# Patient Record
Sex: Female | Born: 1990 | Race: Black or African American | Hispanic: Yes | Marital: Single | Smoking: Never smoker
Health system: Southern US, Community
[De-identification: ages and names within clinical notes are randomized; demographics above are authoritative.]

## PROBLEM LIST (undated history)

## (undated) DIAGNOSIS — J45909 Unspecified asthma, uncomplicated: Secondary | ICD-10-CM

## (undated) DIAGNOSIS — N946 Dysmenorrhea, unspecified: Secondary | ICD-10-CM

## (undated) HISTORY — PX: TONSILLECTOMY: SUR1361

## (undated) HISTORY — DX: Unspecified asthma, uncomplicated: J45.909

---

## 2013-08-04 ENCOUNTER — Ambulatory Visit: Payer: Self-pay

## 2013-08-04 DIAGNOSIS — Z3400 Encounter for supervision of normal first pregnancy, unspecified trimester: Secondary | ICD-10-CM

## 2013-08-04 DIAGNOSIS — O3680X0 Pregnancy with inconclusive fetal viability, not applicable or unspecified: Secondary | ICD-10-CM

## 2013-08-05 ENCOUNTER — Other Ambulatory Visit: Payer: Self-pay | Admitting: Obstetrics & Gynecology

## 2013-08-05 ENCOUNTER — Ambulatory Visit (HOSPITAL_COMMUNITY): Payer: Medicaid Other

## 2013-08-05 ENCOUNTER — Ambulatory Visit (HOSPITAL_COMMUNITY)
Admission: RE | Admit: 2013-08-05 | Discharge: 2013-08-05 | Disposition: A | Discharge status: Home / Self Care | Payer: Medicaid Other | Source: Ambulatory Visit | Attending: Obstetrics & Gynecology | Admitting: Obstetrics & Gynecology

## 2013-08-05 DIAGNOSIS — Z3689 Encounter for other specified antenatal screening: Secondary | ICD-10-CM | POA: Insufficient documentation

## 2013-08-05 DIAGNOSIS — O3680X0 Pregnancy with inconclusive fetal viability, not applicable or unspecified: Secondary | ICD-10-CM | POA: Insufficient documentation

## 2013-08-05 LAB — OBSTETRIC PANEL
Basophils Relative: 0 % (ref 0–1)
Eosinophils Absolute: 0.1 10*3/uL (ref 0.0–0.7)
Eosinophils Relative: 1 % (ref 0–5)
Lymphs Abs: 2.4 10*3/uL (ref 0.7–4.0)
MCH: 26.4 pg (ref 26.0–34.0)
MCHC: 33 g/dL (ref 30.0–36.0)
MCV: 80.1 fL (ref 78.0–100.0)
Platelets: 326 10*3/uL (ref 150–400)
RBC: 4.43 MIL/uL (ref 3.87–5.11)
RDW: 14.6 % (ref 11.5–15.5)
Rh Type: POSITIVE

## 2013-08-05 LAB — HIV ANTIBODY (ROUTINE TESTING W REFLEX): HIV: NONREACTIVE

## 2013-08-06 LAB — CULTURE, OB URINE: Colony Count: 50000

## 2013-08-06 LAB — HEMOGLOBINOPATHY EVALUATION
Hgb A2 Quant: 2.6 % (ref 2.2–3.2)
Hgb A: 97.4 % (ref 96.8–97.8)
Hgb S Quant: 0 %

## 2013-08-19 ENCOUNTER — Encounter: Payer: Self-pay | Admitting: Obstetrics & Gynecology

## 2013-09-02 ENCOUNTER — Encounter: Payer: Self-pay | Admitting: Obstetrics & Gynecology

## 2013-09-04 ENCOUNTER — Ambulatory Visit (INDEPENDENT_AMBULATORY_CARE_PROVIDER_SITE_OTHER): Payer: Medicaid Other | Admitting: Family Medicine

## 2013-09-04 ENCOUNTER — Encounter: Payer: Self-pay | Admitting: Family Medicine

## 2013-09-04 ENCOUNTER — Encounter: Payer: Self-pay | Admitting: *Deleted

## 2013-09-04 VITALS — BP 113/71 | Ht 63.0 in | Wt 226.6 lb

## 2013-09-04 DIAGNOSIS — Z3492 Encounter for supervision of normal pregnancy, unspecified, second trimester: Secondary | ICD-10-CM

## 2013-09-04 DIAGNOSIS — R7309 Other abnormal glucose: Secondary | ICD-10-CM | POA: Insufficient documentation

## 2013-09-04 DIAGNOSIS — O99810 Abnormal glucose complicating pregnancy: Secondary | ICD-10-CM

## 2013-09-04 DIAGNOSIS — Z3490 Encounter for supervision of normal pregnancy, unspecified, unspecified trimester: Secondary | ICD-10-CM | POA: Insufficient documentation

## 2013-09-04 MED ORDER — PRENATAL 27-0.8 MG PO TABS: 1.0000 | ORAL_TABLET | Freq: Every day | ORAL | Status: DC

## 2013-09-04 NOTE — Progress Notes (Signed)
Pulse: 97 Had pap in April at [facility], per patient was normal.

## 2013-09-04 NOTE — Patient Instructions (Addendum)
Pregnancy - Second Trimester The second trimester of pregnancy (3 to 6 months) is a period of rapid growth for you and your baby. At the end of the sixth month, your baby is about 9 inches long and weighs 1 1/2 pounds. You will begin to feel the baby move between 18 and 20 weeks of the pregnancy. This is called quickening. Weight gain is faster. A clear fluid (colostrum) may leak out of your breasts. You may feel small contractions of the womb (uterus). This is known as false labor or Braxton-Hicks contractions. This is like a practice for labor when the baby is ready to be born. Usually, the problems with morning sickness have usually passed by the end of your first trimester. Some women develop small dark blotches (called cholasma, mask of pregnancy) on their face that usually goes away after the baby is born. Exposure to the sun makes the blotches worse. Acne may also develop in some pregnant women and pregnant women who have acne, may find that it goes away. PRENATAL EXAMS  Blood work may continue to be done during prenatal exams. These tests are done to check on your health and the probable health of your baby. Blood work is used to follow your blood levels (hemoglobin). Anemia (low hemoglobin) is common during pregnancy. Iron and vitamins are given to help prevent this. You will also be checked for diabetes between 24 and 28 weeks of the pregnancy. Some of the previous blood tests may be repeated.  The size of the uterus is measured during each visit. This is to make sure that the baby is continuing to grow properly according to the dates of the pregnancy.  Your blood pressure is checked every prenatal visit. This is to make sure you are not getting toxemia.  Your urine is checked to make sure you do not have an infection, diabetes or protein in the urine.  Your weight is checked often to make sure gains are happening at the suggested rate. This is to ensure that both you and your baby are  growing normally.  Sometimes, an ultrasound is performed to confirm the proper growth and development of the baby. This is a test which bounces harmless sound waves off the baby so your caregiver can more accurately determine due dates. Sometimes, a test is done on the amniotic fluid surrounding the baby. This test is called an amniocentesis. The amniotic fluid is obtained by sticking a needle into the belly (abdomen). This is done to check the chromosomes in instances where there is a concern about possible genetic problems with the baby. It is also sometimes done near the end of pregnancy if an early delivery is required. In this case, it is done to help make sure the baby's lungs are mature enough for the baby to live outside of the womb. CHANGES OCCURING IN THE SECOND TRIMESTER OF PREGNANCY Your body goes through many changes during pregnancy. They vary from person to person. Talk to your caregiver about changes you notice that you are concerned about.  During the second trimester, you will likely have an increase in your appetite. It is normal to have cravings for certain foods. This varies from person to person and pregnancy to pregnancy.  Your lower abdomen will begin to bulge.  You may have to urinate more often because the uterus and baby are pressing on your bladder. It is also common to get more bladder infections during pregnancy. You can help this by drinking lots of fluids   and emptying your bladder before and after intercourse.  You may begin to get stretch marks on your hips, abdomen, and breasts. These are normal changes in the body during pregnancy. There are no exercises or medicines to take that prevent this change.  You may begin to develop swollen and bulging veins (varicose veins) in your legs. Wearing support hose, elevating your feet for 15 minutes, 3 to 4 times a day and limiting salt in your diet helps lessen the problem.  Heartburn may develop as the uterus grows and  pushes up against the stomach. Antacids recommended by your caregiver helps with this problem. Also, eating smaller meals 4 to 5 times a day helps.  Constipation can be treated with a stool softener or adding bulk to your diet. Drinking lots of fluids, and eating vegetables, fruits, and whole grains are helpful.  Exercising is also helpful. If you have been very active up until your pregnancy, most of these activities can be continued during your pregnancy. If you have been less active, it is helpful to start an exercise program such as walking.  Hemorrhoids may develop at the end of the second trimester. Warm sitz baths and hemorrhoid cream recommended by your caregiver helps hemorrhoid problems.  Backaches may develop during this time of your pregnancy. Avoid heavy lifting, wear low heal shoes, and practice good posture to help with backache problems.  Some pregnant women develop tingling and numbness of their hand and fingers because of swelling and tightening of ligaments in the wrist (carpel tunnel syndrome). This goes away after the baby is born.  As your breasts enlarge, you may have to get a bigger bra. Get a comfortable, cotton, support bra. Do not get a nursing bra until the last month of the pregnancy if you will be nursing the baby.  You may get a dark line from your belly button to the pubic area called the linea nigra.  You may develop rosy cheeks because of increase blood flow to the face.  You may develop spider looking lines of the face, neck, arms, and chest. These go away after the baby is born. HOME CARE INSTRUCTIONS   It is extremely important to avoid all smoking, herbs, alcohol, and unprescribed drugs during your pregnancy. These chemicals affect the formation and growth of the baby. Avoid these chemicals throughout the pregnancy to ensure the delivery of a healthy infant.  Most of your home care instructions are the same as suggested for the first trimester of your  pregnancy. Keep your caregiver's appointments. Follow your caregiver's instructions regarding medicine use, exercise, and diet.  During pregnancy, you are providing food for you and your baby. Continue to eat regular, well-balanced meals. Choose foods such as meat, fish, milk and other low fat dairy products, vegetables, fruits, and whole-grain breads and cereals. Your caregiver will tell you of the ideal weight gain.  A physical sexual relationship may be continued up until near the end of pregnancy if there are no other problems. Problems could include early (premature) leaking of amniotic fluid from the membranes, vaginal bleeding, abdominal pain, or other medical or pregnancy problems.  Exercise regularly if there are no restrictions. Check with your caregiver if you are unsure of the safety of some of your exercises. The greatest weight gain will occur in the last 2 trimesters of pregnancy. Exercise will help you:  Control your weight.  Get you in shape for labor and delivery.  Lose weight after you have the baby.  Wear   a good support or jogging bra for breast tenderness during pregnancy. This may help if worn during sleep. Pads or tissues may be used in the bra if you are leaking colostrum.  Do not use hot tubs, steam rooms or saunas throughout the pregnancy.  Wear your seat belt at all times when driving. This protects you and your baby if you are in an accident.  Avoid raw meat, uncooked cheese, cat litter boxes, and soil used by cats. These carry germs that can cause birth defects in the baby.  The second trimester is also a good time to visit your dentist for your dental health if this has not been done yet. Getting your teeth cleaned is okay. Use a soft toothbrush. Brush gently during pregnancy.  It is easier to leak urine during pregnancy. Tightening up and strengthening the pelvic muscles will help with this problem. Practice stopping your urination while you are going to the  bathroom. These are the same muscles you need to strengthen. It is also the muscles you would use as if you were trying to stop from passing gas. You can practice tightening these muscles up 10 times a set and repeating this about 3 times per day. Once you know what muscles to tighten up, do not perform these exercises during urination. It is more likely to contribute to an infection by backing up the urine.  Ask for help if you have financial, counseling, or nutritional needs during pregnancy. Your caregiver will be able to offer counseling for these needs as well as refer you for other special needs.  Your skin may become oily. If so, wash your face with mild soap, use non-greasy moisturizer and oil or cream based makeup. MEDICINES AND DRUG USE IN PREGNANCY  Take prenatal vitamins as directed. The vitamin should contain 1 milligram of folic acid. Keep all vitamins out of reach of children. Only a couple vitamins or tablets containing iron may be fatal to a baby or young child when ingested.  Avoid use of all medicines, including herbs, over-the-counter medicines, not prescribed or suggested by your caregiver. Only take over-the-counter or prescription medicines for pain, discomfort, or fever as directed by your caregiver. Do not use aspirin.  Let your caregiver also know about herbs you may be using.  Alcohol is related to a number of birth defects. This includes fetal alcohol syndrome. All alcohol, in any form, should be avoided completely. Smoking will cause low birth rate and premature babies.  Street or illegal drugs are very harmful to the baby. They are absolutely forbidden. A baby born to an addicted mother will be addicted at birth. The baby will go through the same withdrawal an adult does. SEEK MEDICAL CARE IF:  You have any concerns or worries during your pregnancy. It is better to call with your questions if you feel they cannot wait, rather than worry about them. SEEK IMMEDIATE  MEDICAL CARE IF:   An unexplained oral temperature above 102 F (38.9 C) develops, or as your caregiver suggests.  You have leaking of fluid from the vagina (birth canal). If leaking membranes are suspected, take your temperature and tell your caregiver of this when you call.  There is vaginal spotting, bleeding, or passing clots. Tell your caregiver of the amount and how many pads are used. Light spotting in pregnancy is common, especially following intercourse.  You develop a bad smelling vaginal discharge with a change in the color from clear to white.  You continue to feel   sick to your stomach (nauseated) and have no relief from remedies suggested. You vomit blood or coffee ground-like materials.  You lose more than 2 pounds of weight or gain more than 2 pounds of weight over 1 week, or as suggested by your caregiver.  You notice swelling of your face, hands, feet, or legs.  You get exposed to German measles and have never had them.  You are exposed to fifth disease or chickenpox.  You develop belly (abdominal) pain. Round ligament discomfort is a common non-cancerous (benign) cause of abdominal pain in pregnancy. Your caregiver still must evaluate you.  You develop a bad headache that does not go away.  You develop fever, diarrhea, pain with urination, or shortness of breath.  You develop visual problems, blurry, or double vision.  You fall or are in a car accident or any kind of trauma.  There is mental or physical violence at home. Document Released: 11/21/2001 Document Revised: 08/21/2012 Document Reviewed: 05/26/2009 [employer] Patient Information 2014 [employer].  

## 2013-09-04 NOTE — Progress Notes (Signed)
U/S scheduled 09/09/13 at 930 am.

## 2013-09-04 NOTE — Progress Notes (Signed)
Nutrition note: 1st visit consult Pt has h/o obesity Pt has gained 6.6# @ [redacted]w[redacted]d, which is wnl. Pt reports eating 3 meals & 0-1 snack/d. Pt is taking PNV. Pt reports occ N/V and heartburn. Pt received verbal & written education on general nutrition during pregnancy. Discussed tips to decrease N/V and heartburn. Discussed wt gain goals of 11-20# or 0.5#/wk. Pt agrees to continue taking PNV. Pt does not have WIC but plans to apply. Pt plans to BF. F/u if referred Blondell Reveal, MS, RD, LDN

## 2013-09-04 NOTE — Progress Notes (Signed)
  Subjective:    Jade Clark is being seen today for her first obstetrical visit.  This is a planned pregnancy. She is at [redacted]w[redacted]d gestation by LMP c/w [place]. Her obstetrical history is significant for obesity and family hx of DM. Relationship with FOB: significant other, living together. Patient does intend to breast feed. Pregnancy history fully reviewed.  Patient reports no complaints.  Review of Systems:   Review of Systems See above Objective:     BP 113/71  Ht 5\' 3"  (1.6 m)  Wt 226 lb 9.6 oz (102.785 kg)  BMI 40.15 kg/m2 Physical Exam  Exam BP 113/71  Ht 5\' 3"  (1.6 m)  Wt 226 lb 9.6 oz (102.785 kg)  BMI 40.15 kg/m2 GENERAL: Well-developed, well-nourished female in no acute distress.  HEENT: Normocephalic, atraumatic. Sclerae anicteric.  NECK: Supple. Normal thyroid.  LUNGS: Clear to auscultation bilaterally.  HEART: Regular rate and rhythm. BREASTS: deferred ABDOMEN: Soft, nontender, nondistended. No organomegaly. PELVIC: Normal external female genitalia. Vagina is pink and rugated.  Normal discharge. Normal cervix contour. Uterus is appropriate for dating. No adnexal mass or tenderness.  EXTREMITIES: No cyanosis, clubbing, or edema, 2+ distal pulses.    Assessment:    Pregnancy: G2P0010 Patient Active Problem List   Diagnosis Date Noted  . Supervision of normal pregnancy 09/04/2013  . Abnormal glucose tolerance test 09/04/2013       Plan:     Initial labs reviewed- done last week Abnormal 1 hour (150)- 3 hour done today  PAP not done as pt states she had one in April Prenatal vitamins. Problem list reviewed and updated. AFP3 discussed: ordered. Role of ultrasound in pregnancy discussed; fetal survey: requested. Hx of asthma- currently just using prn albuterol. Advised to let us know if this worsens Follow up in 4 weeks. 50% of 30 min visit spent on counseling and coordination of care.     [name] L 09/04/2013

## 2013-09-05 LAB — GLUCOSE TOLERANCE, 3 HOURS
Glucose Tolerance, 1 hour: 150 mg/dL (ref 70–189)
Glucose Tolerance, 2 hour: 106 mg/dL (ref 70–164)

## 2013-09-06 ENCOUNTER — Encounter: Payer: Self-pay | Admitting: *Deleted

## 2013-09-09 ENCOUNTER — Ambulatory Visit (HOSPITAL_COMMUNITY)
Admission: RE | Admit: 2013-09-09 | Discharge: 2013-09-09 | Disposition: A | Discharge status: Home / Self Care | Payer: Medicaid Other | Source: Ambulatory Visit | Attending: Family Medicine | Admitting: Family Medicine

## 2013-09-09 ENCOUNTER — Encounter: Payer: Self-pay | Admitting: Family Medicine

## 2013-09-09 ENCOUNTER — Other Ambulatory Visit: Payer: Self-pay | Admitting: Family Medicine

## 2013-09-09 DIAGNOSIS — Z3492 Encounter for supervision of normal pregnancy, unspecified, second trimester: Secondary | ICD-10-CM

## 2013-09-09 DIAGNOSIS — Z3689 Encounter for other specified antenatal screening: Secondary | ICD-10-CM | POA: Insufficient documentation

## 2013-09-09 LAB — AFP, QUAD SCREEN
Curr Gest Age: 19.1 wks.days
Down Syndrome Scr Risk Est: 1:8000 {titer}
INH: 197.7 pg/mL
Interpretation-AFP: NEGATIVE
MoM for INH: 1.37
MoM for hCG: 0.58
Osb Risk: 1:35800 {titer}
Tri 18 Scr Risk Est: NEGATIVE
Trisomy 18 (Edward) Syndrome Interp.: 1:8760 {titer}
uE3 Mom: 0.72

## 2013-10-02 ENCOUNTER — Ambulatory Visit (INDEPENDENT_AMBULATORY_CARE_PROVIDER_SITE_OTHER): Payer: Medicaid Other | Admitting: Family

## 2013-10-02 VITALS — BP 118/77 | Temp 98.0°F | Wt 225.0 lb

## 2013-10-02 DIAGNOSIS — Z3492 Encounter for supervision of normal pregnancy, unspecified, second trimester: Secondary | ICD-10-CM

## 2013-10-02 DIAGNOSIS — Z3480 Encounter for supervision of other normal pregnancy, unspecified trimester: Secondary | ICD-10-CM

## 2013-10-02 LAB — POCT URINALYSIS DIP (DEVICE)
Bilirubin Urine: NEGATIVE
Glucose, UA: NEGATIVE mg/dL
Ketones, ur: NEGATIVE mg/dL
Nitrite: NEGATIVE
Specific Gravity, Urine: 1.02 (ref 1.005–1.030)
pH: 7 (ref 5.0–8.0)

## 2013-10-02 NOTE — Progress Notes (Signed)
Reviewed [place] results and 3 hr results; schedule repeat in one week.  Right sided pain, intermittent in nature; no bleeding or leaking > provided reassurance.  Do 3 hr test at next visit.

## 2013-10-02 NOTE — Progress Notes (Signed)
Pulse- 90 Pain-sharp pain on right side

## 2013-10-09 ENCOUNTER — Ambulatory Visit (HOSPITAL_COMMUNITY)
Admission: RE | Admit: 2013-10-09 | Discharge: 2013-10-09 | Disposition: A | Discharge status: Home / Self Care | Payer: Medicaid Other | Source: Ambulatory Visit | Attending: Family | Admitting: Family

## 2013-10-09 ENCOUNTER — Other Ambulatory Visit: Payer: Self-pay | Admitting: Family

## 2013-10-09 DIAGNOSIS — Z3492 Encounter for supervision of normal pregnancy, unspecified, second trimester: Secondary | ICD-10-CM

## 2013-10-09 DIAGNOSIS — Z3689 Encounter for other specified antenatal screening: Secondary | ICD-10-CM | POA: Insufficient documentation

## 2013-10-12 ENCOUNTER — Encounter: Payer: Self-pay | Admitting: Family

## 2013-10-30 ENCOUNTER — Encounter: Payer: Self-pay | Admitting: Obstetrics and Gynecology

## 2013-10-30 ENCOUNTER — Ambulatory Visit (INDEPENDENT_AMBULATORY_CARE_PROVIDER_SITE_OTHER): Payer: Medicaid Other | Admitting: Obstetrics and Gynecology

## 2013-10-30 VITALS — BP 130/80 | Wt 228.3 lb

## 2013-10-30 DIAGNOSIS — Z3492 Encounter for supervision of normal pregnancy, unspecified, second trimester: Secondary | ICD-10-CM

## 2013-10-30 DIAGNOSIS — N898 Other specified noninflammatory disorders of vagina: Secondary | ICD-10-CM

## 2013-10-30 DIAGNOSIS — O99810 Abnormal glucose complicating pregnancy: Secondary | ICD-10-CM

## 2013-10-30 DIAGNOSIS — R7309 Other abnormal glucose: Secondary | ICD-10-CM

## 2013-10-30 LAB — POCT URINALYSIS DIP (DEVICE)
Bilirubin Urine: NEGATIVE
Hgb urine dipstick: NEGATIVE
Nitrite: NEGATIVE
Protein, ur: NEGATIVE mg/dL
Urobilinogen, UA: 0.2 mg/dL (ref 0.0–1.0)
pH: 7 (ref 5.0–8.0)

## 2013-10-30 NOTE — Progress Notes (Signed)
p=101 

## 2013-10-30 NOTE — Progress Notes (Signed)
Patient doing well without complaints. FM/PTL precautions reviewed. 3 hr GTT today. Patient complaining of white pruritic discharge. Wet prep collected. White thin discharge on exam

## 2013-10-30 NOTE — Addendum Note (Signed)
Addended by: Franchot Mimes on: 10/30/2013 09:47 AM   Modules accepted: Orders

## 2013-10-31 ENCOUNTER — Other Ambulatory Visit: Payer: Self-pay | Admitting: Obstetrics and Gynecology

## 2013-10-31 DIAGNOSIS — Z3492 Encounter for supervision of normal pregnancy, unspecified, second trimester: Secondary | ICD-10-CM

## 2013-10-31 LAB — GLUCOSE TOLERANCE, 3 HOURS
Glucose Tolerance, 2 hour: 114 mg/dL (ref 70–164)
Glucose Tolerance, Fasting: 80 mg/dL (ref 70–104)
Glucose, GTT - 3 Hour: 62 mg/dL — ABNORMAL LOW (ref 70–144)

## 2013-10-31 LAB — WET PREP, GENITAL: Trich, Wet Prep: NONE SEEN

## 2013-10-31 MED ORDER — METRONIDAZOLE 500 MG PO TABS: 500.0000 mg | ORAL_TABLET | Freq: Two times a day (BID) | ORAL | Status: DC

## 2013-10-31 MED ORDER — FLUCONAZOLE 150 MG PO TABS: 150.0000 mg | ORAL_TABLET | Freq: Once | ORAL | Status: DC

## 2013-11-03 ENCOUNTER — Telehealth: Payer: Self-pay

## 2013-11-03 NOTE — Telephone Encounter (Signed)
Message copied by Louanna Raw on Mon Nov 03, 2013 10:27 AM ------      Message from: Jade Clark      Created: Fri Oct 31, 2013 10:36 AM       Please inform patient of positive yeast and BV infection. Rx e-prescribed for both ------

## 2013-11-03 NOTE — Telephone Encounter (Signed)
Called pt. And informed her of her yeast infection and bacterial vaginosis infection. Informed her she can pick up the prescriptions at her [facility] pharmacy. Pt. Stated she is in [place] for the holidays; I explained that the [facility] pharmacy near her should have no problem transferring and filling the prescription so that she can get it today. Pt. Verbalized understanding and stated she had no other questions.

## 2013-11-14 ENCOUNTER — Telehealth: Payer: Self-pay | Admitting: *Deleted

## 2013-11-14 NOTE — Telephone Encounter (Signed)
Spoke with patient, patient states she has been sick for 1 day, has been taking antibiotic for 3 days.  She has taken diflucan in the past with no difficulty.  Educated patient to take with food. Pt verbalizes understanding.

## 2013-11-14 NOTE — Telephone Encounter (Signed)
Pt called nurse line requesting to speak with someone concerning sickness from antibiotic.

## 2013-11-26 ENCOUNTER — Ambulatory Visit (INDEPENDENT_AMBULATORY_CARE_PROVIDER_SITE_OTHER): Payer: Medicaid Other | Admitting: Obstetrics and Gynecology

## 2013-11-26 ENCOUNTER — Encounter: Payer: Self-pay | Admitting: Family Medicine

## 2013-11-26 VITALS — BP 124/83 | Wt 226.2 lb

## 2013-11-26 DIAGNOSIS — O9989 Other specified diseases and conditions complicating pregnancy, childbirth and the puerperium: Secondary | ICD-10-CM

## 2013-11-26 DIAGNOSIS — R7309 Other abnormal glucose: Secondary | ICD-10-CM

## 2013-11-26 DIAGNOSIS — O2610 Low weight gain in pregnancy, unspecified trimester: Secondary | ICD-10-CM | POA: Insufficient documentation

## 2013-11-26 DIAGNOSIS — Z23 Encounter for immunization: Secondary | ICD-10-CM

## 2013-11-26 DIAGNOSIS — O2613 Low weight gain in pregnancy, third trimester: Secondary | ICD-10-CM

## 2013-11-26 DIAGNOSIS — Z3493 Encounter for supervision of normal pregnancy, unspecified, third trimester: Secondary | ICD-10-CM

## 2013-11-26 LAB — POCT URINALYSIS DIP (DEVICE)
Bilirubin Urine: NEGATIVE
Leukocytes, UA: NEGATIVE
Nitrite: NEGATIVE
Protein, ur: NEGATIVE mg/dL
Specific Gravity, Urine: 1.02 (ref 1.005–1.030)
pH: 7.5 (ref 5.0–8.0)

## 2013-11-26 MED ORDER — TETANUS-DIPHTH-ACELL PERTUSSIS 5-2.5-18.5 LF-MCG/0.5 IM SUSP: 0.5000 mL | Freq: Once | INTRAMUSCULAR | Status: DC

## 2013-11-26 NOTE — Progress Notes (Signed)
+   FM; Denies LOF, VB, or contractions - Discusses Breast feeding and provided info - Wants to get Nexplanon for contraception - Schedule repeat US today' - Flu and Tdap today

## 2013-11-26 NOTE — Progress Notes (Signed)
Pulse: 99 

## 2013-11-26 NOTE — Patient Instructions (Signed)
Third Trimester of Pregnancy  The third trimester is from week 29 through week 42, months 7 through 9. The third trimester is a time when the fetus is growing rapidly. At the end of the ninth month, the fetus is about 20 inches in length and weighs 6 10 pounds.   BODY CHANGES  Your body goes through many changes during pregnancy. The changes vary from woman to woman.    Your weight will continue to increase. You can expect to gain 25 35 pounds (11 16 kg) by the end of the pregnancy.   You may begin to get stretch marks on your hips, abdomen, and breasts.   You may urinate more often because the fetus is moving lower into your pelvis and pressing on your bladder.   You may develop or continue to have heartburn as a result of your pregnancy.   You may develop constipation because certain hormones are causing the muscles that push waste through your intestines to slow down.   You may develop hemorrhoids or swollen, bulging veins (varicose veins).   You may have pelvic pain because of the weight gain and pregnancy hormones relaxing your joints between the bones in your pelvis. Back aches may result from over exertion of the muscles supporting your posture.   Your breasts will continue to grow and be tender. A yellow discharge may leak from your breasts called colostrum.   Your belly button may stick out.   You may feel short of breath because of your expanding uterus.   You may notice the fetus "dropping," or moving lower in your abdomen.   You may have a bloody mucus discharge. This usually occurs a few days to a week before labor begins.   Your cervix becomes thin and soft (effaced) near your due date.  WHAT TO EXPECT AT YOUR PRENATAL EXAMS   You will have prenatal exams every 2 weeks until week 36. Then, you will have weekly prenatal exams. During a routine prenatal visit:   You will be weighed to make sure you and the fetus are growing normally.   Your blood pressure is taken.   Your abdomen will be  measured to track your baby's growth.   The fetal heartbeat will be listened to.   Any test results from the previous visit will be discussed.   You may have a cervical check near your due date to see if you have effaced.  At around 36 weeks, your caregiver will check your cervix. At the same time, your caregiver will also perform a test on the secretions of the vaginal tissue. This test is to determine if a type of bacteria, Group B streptococcus, is present. Your caregiver will explain this further.  Your caregiver may ask you:   What your birth plan is.   How you are feeling.   If you are feeling the baby move.   If you have had any abnormal symptoms, such as leaking fluid, bleeding, severe headaches, or abdominal cramping.   If you have any questions.  Other tests or screenings that may be performed during your third trimester include:   Blood tests that check for low iron levels (anemia).   Fetal testing to check the health, activity level, and growth of the fetus. Testing is done if you have certain medical conditions or if there are problems during the pregnancy.  FALSE LABOR  You may feel small, irregular contractions that eventually go away. These are called Braxton Hicks contractions, or   false labor. Contractions may last for hours, days, or even weeks before true labor sets in. If contractions come at regular intervals, intensify, or become painful, it is best to be seen by your caregiver.   SIGNS OF LABOR    Menstrual-like cramps.   Contractions that are 5 minutes apart or less.   Contractions that start on the top of the uterus and spread down to the lower abdomen and back.   A sense of increased pelvic pressure or back pain.   A watery or bloody mucus discharge that comes from the vagina.  If you have any of these signs before the 37th week of pregnancy, call your caregiver right away. You need to go to the hospital to get checked immediately.  HOME CARE INSTRUCTIONS    Avoid all  smoking, herbs, alcohol, and unprescribed drugs. These chemicals affect the formation and growth of the baby.   Follow your caregiver's instructions regarding medicine use. There are medicines that are either safe or unsafe to take during pregnancy.   Exercise only as directed by your caregiver. Experiencing uterine cramps is a good sign to stop exercising.   Continue to eat regular, healthy meals.   Wear a good support bra for breast tenderness.   Do not use hot tubs, steam rooms, or saunas.   Wear your seat belt at all times when driving.   Avoid raw meat, uncooked cheese, cat litter boxes, and soil used by cats. These carry germs that can cause birth defects in the baby.   Take your prenatal vitamins.   Try taking a stool softener (if your caregiver approves) if you develop constipation. Eat more high-fiber foods, such as fresh vegetables or fruit and whole grains. Drink plenty of fluids to keep your urine clear or pale yellow.   Take warm sitz baths to soothe any pain or discomfort caused by hemorrhoids. Use hemorrhoid cream if your caregiver approves.   If you develop varicose veins, wear support hose. Elevate your feet for 15 minutes, 3 4 times a day. Limit salt in your diet.   Avoid heavy lifting, wear low heal shoes, and practice good posture.   Rest a lot with your legs elevated if you have leg cramps or low back pain.   Visit your dentist if you have not gone during your pregnancy. Use a soft toothbrush to brush your teeth and be gentle when you floss.   A sexual relationship may be continued unless your caregiver directs you otherwise.   Do not travel far distances unless it is absolutely necessary and only with the approval of your caregiver.   Take prenatal classes to understand, practice, and ask questions about the labor and delivery.   Make a trial run to the hospital.   Pack your hospital bag.   Prepare the baby's nursery.   Continue to go to all your prenatal visits as directed  by your caregiver.  SEEK MEDICAL CARE IF:   You are unsure if you are in labor or if your water has broken.   You have dizziness.   You have mild pelvic cramps, pelvic pressure, or nagging pain in your abdominal area.   You have persistent nausea, vomiting, or diarrhea.   You have a bad smelling vaginal discharge.   You have pain with urination.  SEEK IMMEDIATE MEDICAL CARE IF:    You have a fever.   You are leaking fluid from your vagina.   You have spotting or bleeding from your vagina.     You have severe abdominal cramping or pain.   You have rapid weight loss or gain.   You have shortness of breath with chest pain.   You notice sudden or extreme swelling of your face, hands, ankles, feet, or legs.   You have not felt your baby move in over an hour.   You have severe headaches that do not go away with medicine.   You have vision changes.  Document Released: 11/21/2001 Document Revised: 07/30/2013 Document Reviewed: 01/28/2013  [employer] Patient Information 2014 [employer].

## 2013-11-27 ENCOUNTER — Other Ambulatory Visit: Payer: Self-pay | Admitting: Family Medicine

## 2013-11-27 ENCOUNTER — Ambulatory Visit (HOSPITAL_COMMUNITY)
Admission: RE | Admit: 2013-11-27 | Discharge: 2013-11-27 | Disposition: A | Discharge status: Home / Self Care | Payer: Medicaid Other | Source: Ambulatory Visit | Attending: Obstetrics and Gynecology | Admitting: Obstetrics and Gynecology

## 2013-11-27 DIAGNOSIS — O2613 Low weight gain in pregnancy, third trimester: Secondary | ICD-10-CM

## 2013-11-27 DIAGNOSIS — E669 Obesity, unspecified: Secondary | ICD-10-CM | POA: Insufficient documentation

## 2013-11-27 DIAGNOSIS — O9989 Other specified diseases and conditions complicating pregnancy, childbirth and the puerperium: Secondary | ICD-10-CM | POA: Insufficient documentation

## 2013-12-02 ENCOUNTER — Other Ambulatory Visit: Payer: Self-pay | Admitting: Family Medicine

## 2013-12-02 DIAGNOSIS — R7309 Other abnormal glucose: Secondary | ICD-10-CM

## 2013-12-03 ENCOUNTER — Ambulatory Visit (HOSPITAL_COMMUNITY)
Admission: RE | Admit: 2013-12-03 | Discharge: 2013-12-03 | Disposition: A | Discharge status: Home / Self Care | Payer: Medicaid Other | Source: Ambulatory Visit | Attending: Obstetrics and Gynecology | Admitting: Obstetrics and Gynecology

## 2013-12-03 DIAGNOSIS — E669 Obesity, unspecified: Secondary | ICD-10-CM | POA: Insufficient documentation

## 2013-12-03 DIAGNOSIS — R7309 Other abnormal glucose: Secondary | ICD-10-CM

## 2013-12-03 DIAGNOSIS — O9989 Other specified diseases and conditions complicating pregnancy, childbirth and the puerperium: Secondary | ICD-10-CM | POA: Insufficient documentation

## 2013-12-05 ENCOUNTER — Encounter: Payer: Self-pay | Admitting: Family Medicine

## 2013-12-05 DIAGNOSIS — O365990 Maternal care for other known or suspected poor fetal growth, unspecified trimester, not applicable or unspecified: Secondary | ICD-10-CM | POA: Insufficient documentation

## 2013-12-11 NOTE — L&D Delivery Note (Signed)
I have seen and examined this patient and I agree with the above. Cord blood collected both for hospital sample and for [facility]. Cam HaiSHAW, Lisett Dirusso 11:56 PM 01/21/2014

## 2013-12-11 NOTE — L&D Delivery Note (Signed)
Delivery Note At 8:50 PM a viable and healthy female was delivered via Vaginal, Spontaneous Delivery (Presentation: ; Occiput Anterior).  APGAR: 8, 9.  Placenta status: Intact, Spontaneous.  Cord: 3 vessels with the following complications: None.    Baby delivered with good maternal effort over intact perineum.  Immediately placed on maternal abdomen.  Good tone, spontaneous cry. Cord Blood drawn for blood bank.  Active management of third stage with cord traction. Placenta intact. 3V cord.  Uterine massage and pitocin. Minimal blood loss.  All counts correct.  Mom and Baby are doing well  Anesthesia: Epidural  Episiotomy: None Lacerations: None Suture Repair: none Est. Blood Loss (mL):   Mom to postpartum.  Baby to Couplet care / Skin to Skin.  Quincy SimmondsFeeney, Diani Jillson L 01/21/2014, 9:20 PM

## 2013-12-17 ENCOUNTER — Ambulatory Visit (INDEPENDENT_AMBULATORY_CARE_PROVIDER_SITE_OTHER): Payer: Medicaid Other | Admitting: Advanced Practice Midwife

## 2013-12-17 VITALS — BP 115/81 | Temp 98.6°F | Wt 221.4 lb

## 2013-12-17 DIAGNOSIS — O365990 Maternal care for other known or suspected poor fetal growth, unspecified trimester, not applicable or unspecified: Secondary | ICD-10-CM

## 2013-12-17 DIAGNOSIS — R7309 Other abnormal glucose: Secondary | ICD-10-CM

## 2013-12-17 LAB — POCT URINALYSIS DIP (DEVICE)
Bilirubin Urine: NEGATIVE
GLUCOSE, UA: NEGATIVE mg/dL
HGB URINE DIPSTICK: NEGATIVE
KETONES UR: NEGATIVE mg/dL
NITRITE: NEGATIVE
Protein, ur: NEGATIVE mg/dL
Specific Gravity, Urine: 1.015 (ref 1.005–1.030)
Urobilinogen, UA: 0.2 mg/dL (ref 0.0–1.0)
pH: 7 (ref 5.0–8.0)

## 2013-12-17 MED ORDER — ENSURE PO LIQD: 237.0000 mL | Freq: Two times a day (BID) | ORAL | Status: DC

## 2013-12-17 MED ORDER — PROMETHAZINE HCL 25 MG PO TABS: 12.5000 mg | ORAL_TABLET | Freq: Four times a day (QID) | ORAL | Status: DC | PRN

## 2013-12-17 MED ORDER — ALBUTEROL SULFATE HFA 108 (90 BASE) MCG/ACT IN AERS: 2.0000 | INHALATION_SPRAY | Freq: Four times a day (QID) | RESPIRATORY_TRACT | Status: AC | PRN

## 2013-12-17 NOTE — Progress Notes (Signed)
p-[id number] 

## 2013-12-17 NOTE — Progress Notes (Signed)
Doing well.  Good fetal movement, denies vaginal bleeding, LOF, regular contractions.  Unable to eat much during the day due to nausea.  Vomiting 2-3x/week only.  [facility]  Pt also reports difficulty sleeping.  Phenergan 12.5-25 mg Q6 hours for nausea and to aid sleep. Start twice weekly testing this week.

## 2013-12-19 ENCOUNTER — Ambulatory Visit (INDEPENDENT_AMBULATORY_CARE_PROVIDER_SITE_OTHER): Payer: Medicaid Other | Admitting: *Deleted

## 2013-12-19 VITALS — BP 112/58

## 2013-12-19 DIAGNOSIS — O365990 Maternal care for other known or suspected poor fetal growth, unspecified trimester, not applicable or unspecified: Secondary | ICD-10-CM

## 2013-12-19 NOTE — Progress Notes (Signed)
P = 100   US @ [facility] on 1/13 for growth, BPP, UA doppler

## 2013-12-19 NOTE — Progress Notes (Signed)
NST 12/19/13 reactive 

## 2013-12-23 ENCOUNTER — Ambulatory Visit (HOSPITAL_COMMUNITY)
Admission: RE | Admit: 2013-12-23 | Discharge: 2013-12-23 | Disposition: A | Discharge status: Home / Self Care | Payer: Medicaid Other | Source: Ambulatory Visit | Attending: Advanced Practice Midwife | Admitting: Advanced Practice Midwife

## 2013-12-23 VITALS — BP 125/77 | HR 98 | Wt 225.0 lb

## 2013-12-23 DIAGNOSIS — O365990 Maternal care for other known or suspected poor fetal growth, unspecified trimester, not applicable or unspecified: Secondary | ICD-10-CM | POA: Insufficient documentation

## 2013-12-23 DIAGNOSIS — O99210 Obesity complicating pregnancy, unspecified trimester: Secondary | ICD-10-CM

## 2013-12-23 DIAGNOSIS — O9989 Other specified diseases and conditions complicating pregnancy, childbirth and the puerperium: Secondary | ICD-10-CM | POA: Insufficient documentation

## 2013-12-23 DIAGNOSIS — O99891 Other specified diseases and conditions complicating pregnancy: Secondary | ICD-10-CM | POA: Insufficient documentation

## 2013-12-23 DIAGNOSIS — J45909 Unspecified asthma, uncomplicated: Secondary | ICD-10-CM | POA: Insufficient documentation

## 2013-12-23 DIAGNOSIS — E669 Obesity, unspecified: Secondary | ICD-10-CM | POA: Insufficient documentation

## 2013-12-25 ENCOUNTER — Other Ambulatory Visit: Payer: Self-pay | Admitting: Family Medicine

## 2013-12-25 ENCOUNTER — Ambulatory Visit (INDEPENDENT_AMBULATORY_CARE_PROVIDER_SITE_OTHER): Payer: Medicaid Other | Admitting: Family Medicine

## 2013-12-25 VITALS — BP 116/59 | Temp 98.4°F | Wt 224.0 lb

## 2013-12-25 DIAGNOSIS — Z3480 Encounter for supervision of other normal pregnancy, unspecified trimester: Secondary | ICD-10-CM

## 2013-12-25 DIAGNOSIS — O365990 Maternal care for other known or suspected poor fetal growth, unspecified trimester, not applicable or unspecified: Secondary | ICD-10-CM

## 2013-12-25 DIAGNOSIS — Z3490 Encounter for supervision of normal pregnancy, unspecified, unspecified trimester: Secondary | ICD-10-CM

## 2013-12-25 LAB — POCT URINALYSIS DIP (DEVICE)
Bilirubin Urine: NEGATIVE
Glucose, UA: NEGATIVE mg/dL
Hgb urine dipstick: NEGATIVE
Ketones, ur: NEGATIVE mg/dL
Leukocytes, UA: NEGATIVE
Nitrite: NEGATIVE
Protein, ur: NEGATIVE mg/dL
Specific Gravity, Urine: 1.02 (ref 1.005–1.030)
Urobilinogen, UA: 0.2 mg/dL (ref 0.0–1.0)
pH: 7 (ref 5.0–8.0)

## 2013-12-25 NOTE — Progress Notes (Signed)
P=73,

## 2013-12-25 NOTE — Patient Instructions (Signed)
Depresin postparto y depresin puerperal  (Postpartum Depression and Baby Blues) El perodo del postparto comienza inmediatamente despus del nacimiento del beb. Durante este tiempo, a menudo hay mucha alegra y emocin. Tambin es un tiempo de cambios considerables en la vida de [name]. Independientemente de las veces que [employer] da a luz, cada nio trae nuevos desafos y se modifica la dinmica de la familia. No es inusual tener sentimientos de emocin acompaados de cambios confusos en los estados de nimo, las emociones y los pensamientos. Todas las [employer] estn en riesgo de desarrollar depresin posparto o depresin puerperal. Estos cambios en el estado de nimo pueden ocurrir inmediatamente despus de dar a luz, o muchos meses despus. La depresin puerperal o la depresin posparto puede ser leve o grave. Adems, se puede resolver rpidamente, o puede ser un trastorno que dure [name].  CAUSAS  Los niveles elevados de hormonas y su rpido descenso son la causa principal de la depresin posparto y la depresin puerperal. Hay un conjunto de hormonas que se modifican radicalmente durante y despus del Psychiatrist. Los estrgenos y la progesterona generalmente disminuyen inmediatamente luego del [place]. Los niveles de hormona tiroidea y el cortisol y esteroides tambin disminuyen rpidamente. Entre otros factores que juegan un papel importante en estos cambios se incluyen los eventos importantes de la vida y los factores genticos.  FACTORES DE RIESGO  Si tiene cualquiera de los siguientes riesgos para la depresin posparto o depresin puerperal, sepa cules son los sntomas a tener en cuenta durante el perodo del posparto. Los factores de riesgo que pueden aumentar la probabilidad de sufrir este trastorno son:   Antecedentes familiares o personales de depresin.  Haber sufrido depresin mientras estuvo embarazada.  Haber sufrido trastornos del estado de nimo en perodos premenstruales o  con el uso de anticonceptivos orales.  Haber sufrido ests excepcional durante la vida.  Tener conflictos maritales.  No tener una red de apoyo social.  Jade Clark un beb con necesidades especiales.  Tener problemas de salud tales como diabetes. SNTOMAS  Los sntomas de la depresin puerperal son:   Electronics engineer en el estado de nimo, como ir desde la extrema felicidad a la extrema tristeza.  Disminucin de Cabin crew.  Dificultad para dormir.  Episodios de llanto, ganas de llorar.  Irritabilidad.  Ansiedad. Los sntomas de la depresion postparto generalmente comienzan dentro del primer mes despus de haber dado a luz. Estos sntomas son:   Dificultad para dormirse o somnolencia excesiva.  Marcada prdida de peso.  Agitacin.  Sentimientos de Haematologist.  Falta de inters en la actividad o la comida. La psicosis puerperal es una enfermedad muy preocupante y puede ser peligrosa. Afortunadamente, es un trastorno raro. Sufrir alguno de los siguientes sntomas es motivo de atencin mdica inmediata. Los sntomas de psicosis puerperal son:   Alucinaciones e ilusiones.  Conducta bizarra o desorganizada.  Confusin o desorientacin. DIAGNSTICO  El diagnstico se realiza por medio de la evaluacin de los sntomas. No hay pruebas mdicas o de laboratorio, que llevan a un diagnstico, pero existen varios cuestionarios que un mdico puede Chemical engineer para identificar la depresin posparto, la depresin o la psicosis puerperal. En algunos casos se utiliza una herramienta de evaluacin denominada [place] de Depresin Postnatal de [name] para diagnosticar este trastorno.  TRATAMIENTO  La depresin postparto por lo general desaparece por s sola en 1 a 2 semanas. El apoyo social es a menudo todo lo que se necesita. Debe ser alentada a dormir y Lawyer o  suficiente. Ocasionalmente, es posible que se le administren medicamentos para ayudarla a dormir.  La depresin  posparto requiere tratamiento, ya que puede durar varios meses o ms tiempo si no se trata. El [address]tratamiento puede incluir terapia individual o de grupo, medicamentos o ambos para Radio producerhacer frente a los Marine scientistfactores sociales, fisiolgicos y psicolgicos que pueden desempear un papel en la depresin. El ejercicio regular, una dieta saludable, el descanso y el apoyo social tambin puede ser muy recomendables.  La psicosis puerperal es ms grave y necesita tratamiento inmediato. A menudo es necesaria la hospitalizacin.  INSTRUCCIONES PARA EL CUIDADO EN EL HOGAR   Descanse todo lo que pueda. Tome una siesta mientras el beb duerme.  Haga ejercicios regularmente. Para algunas mujeres es beneficioso el yoga y las caminatas.  Consuma una dieta balanceada y nutritiva.  Haga pequeas cosas que disfrute hacer. Tome una taza de t, dese un bao de espuma, lea su revista favorita o escuche la msica que ms [place] gusta.  Evite el alcohol.  Pida ayuda con las tareas [address] Streetdomsticas, la cocina, las compras o Rockledgemandados. No trate de hacer todo.  Hable con la gente ms cercana a usted acerca de cmo se siente. Guineaonsiga ayuda de [address] [place] [address] pareja, los miembros de su familia, amigos u otras madres recientes.  Trate de tener pensamientos positivos. Piense en las cosas que le resultan gratificantes.  No pase mucho tiempo sola.  Tome slo la medicacin que le indic el profesional.  Cumpla con todos los controles del postparto.  Hable con su mdico si tiene preocupaciones. SOLICITE ATENCIN MDICA SI:  Shelle Ironiene una reaccin o problemas con su medicamento.  SOLICITE ATENCIN MDICA DE INMEDIATO SI:   Tiene ideas suicidas.  Siente que puede daar al beb o a Engineer, maintenance (IT)otra persona. Document Released: 05/15/2008 Document Revised: 02/19/2012 [facility] Patient Information 2014 [place], [place].

## 2013-12-25 NOTE — Progress Notes (Signed)
NST reviewed and reactive. Discussed case with Dr. Collier BullockWhitecar--once weekly BPP is sufficient antenatal testing--no longer needs NST's.

## 2013-12-30 ENCOUNTER — Encounter (HOSPITAL_COMMUNITY): Payer: Self-pay

## 2013-12-30 ENCOUNTER — Other Ambulatory Visit: Payer: Self-pay | Admitting: Family Medicine

## 2013-12-30 ENCOUNTER — Ambulatory Visit (HOSPITAL_COMMUNITY)
Admission: RE | Admit: 2013-12-30 | Discharge: 2013-12-30 | Disposition: A | Discharge status: Home / Self Care | Payer: Medicaid Other | Source: Ambulatory Visit | Attending: Family Medicine | Admitting: Family Medicine

## 2013-12-30 DIAGNOSIS — O99891 Other specified diseases and conditions complicating pregnancy: Secondary | ICD-10-CM | POA: Insufficient documentation

## 2013-12-30 DIAGNOSIS — E669 Obesity, unspecified: Secondary | ICD-10-CM | POA: Insufficient documentation

## 2013-12-30 DIAGNOSIS — J45909 Unspecified asthma, uncomplicated: Secondary | ICD-10-CM | POA: Insufficient documentation

## 2013-12-30 DIAGNOSIS — O99210 Obesity complicating pregnancy, unspecified trimester: Secondary | ICD-10-CM

## 2013-12-30 DIAGNOSIS — O9989 Other specified diseases and conditions complicating pregnancy, childbirth and the puerperium: Secondary | ICD-10-CM

## 2013-12-30 DIAGNOSIS — O365990 Maternal care for other known or suspected poor fetal growth, unspecified trimester, not applicable or unspecified: Secondary | ICD-10-CM

## 2013-12-31 ENCOUNTER — Other Ambulatory Visit: Payer: Self-pay | Admitting: Family Medicine

## 2013-12-31 DIAGNOSIS — O358XX0 Maternal care for other (suspected) fetal abnormality and damage, not applicable or unspecified: Secondary | ICD-10-CM

## 2014-01-01 ENCOUNTER — Ambulatory Visit (INDEPENDENT_AMBULATORY_CARE_PROVIDER_SITE_OTHER): Payer: Medicaid Other | Admitting: Family Medicine

## 2014-01-01 VITALS — BP 115/76 | Temp 98.6°F | Wt 222.3 lb

## 2014-01-01 DIAGNOSIS — O2610 Low weight gain in pregnancy, unspecified trimester: Secondary | ICD-10-CM

## 2014-01-01 DIAGNOSIS — O9989 Other specified diseases and conditions complicating pregnancy, childbirth and the puerperium: Secondary | ICD-10-CM

## 2014-01-01 DIAGNOSIS — O365990 Maternal care for other known or suspected poor fetal growth, unspecified trimester, not applicable or unspecified: Secondary | ICD-10-CM

## 2014-01-01 LAB — POCT URINALYSIS DIP (DEVICE)
Bilirubin Urine: NEGATIVE
Glucose, UA: NEGATIVE mg/dL
Hgb urine dipstick: NEGATIVE
Ketones, ur: NEGATIVE mg/dL
LEUKOCYTES UA: NEGATIVE
Nitrite: NEGATIVE
Protein, ur: 30 mg/dL — AB
Specific Gravity, Urine: 1.025 (ref 1.005–1.030)
UROBILINOGEN UA: 0.2 mg/dL (ref 0.0–1.0)
pH: 7 (ref 5.0–8.0)

## 2014-01-01 LAB — OB RESULTS CONSOLE GC/CHLAMYDIA
CHLAMYDIA, DNA PROBE: NEGATIVE
Gonorrhea: NEGATIVE

## 2014-01-01 LAB — OB RESULTS CONSOLE GBS: GBS: POSITIVE

## 2014-01-01 NOTE — Progress Notes (Signed)
Cultures today BPP 8/8 with nml dopplers on 1/21.

## 2014-01-01 NOTE — Patient Instructions (Signed)
Breastfeeding Deciding to breastfeed is one of the best choices you can make for you and your baby. A change in hormones during pregnancy causes your breast tissue to grow and increases the number and size of your milk ducts. These hormones also allow proteins, sugars, and fats from your blood supply to make breast milk in your milk-producing glands. Hormones prevent breast milk from being released before your baby is born as well as prompt milk flow after birth. Once breastfeeding has begun, thoughts of your baby, as well as his or her sucking or crying, can stimulate the release of milk from your milk-producing glands.  BENEFITS OF BREASTFEEDING For Your Baby  Your first milk (colostrum) helps your baby's digestive system function better.   There are antibodies in your milk that help your baby fight off infections.   Your baby has a lower incidence of asthma, allergies, and sudden infant death syndrome.   The nutrients in breast milk are better for your baby than infant formulas and are designed uniquely for your baby's needs.   Breast milk improves your baby's brain development.   Your baby is less likely to develop other conditions, such as childhood obesity, asthma, or type 2 diabetes mellitus.  For You   Breastfeeding helps to create a very special bond between you and your baby.   Breastfeeding is convenient. Breast milk is always available at the correct temperature and costs nothing.   Breastfeeding helps to burn calories and helps you lose the weight gained during pregnancy.   Breastfeeding makes your uterus contract to its prepregnancy size faster and slows bleeding (lochia) after you give birth.   Breastfeeding helps to lower your risk of developing type 2 diabetes mellitus, osteoporosis, and breast or ovarian cancer later in life. SIGNS THAT YOUR BABY IS HUNGRY Early Signs of Hunger  Increased alertness or activity.  Stretching.  Movement of the head from  side to side.  Movement of the head and opening of the mouth when the corner of the mouth or cheek is stroked (rooting).  Increased sucking sounds, smacking lips, cooing, sighing, or squeaking.  Hand-to-mouth movements.  Increased sucking of fingers or hands. Late Signs of Hunger  Fussing.  Intermittent crying. Extreme Signs of Hunger Signs of extreme hunger will require calming and consoling before your baby will be able to breastfeed successfully. Do not wait for the following signs of extreme hunger to occur before you initiate breastfeeding:   Restlessness.  A loud, strong cry.   Screaming. BREASTFEEDING BASICS Breastfeeding Initiation  Find a comfortable place to sit or lie down, with your neck and back well supported.  Place a pillow or rolled up blanket under your baby to bring him or her to the level of your breast (if you are seated). Nursing pillows are specially designed to help support your arms and your baby while you breastfeed.  Make sure that your baby's abdomen is facing your abdomen.   Gently massage your breast. With your fingertips, massage from your chest wall toward your nipple in a circular motion. This encourages milk flow. You may need to continue this action during the feeding if your milk flows slowly.  Support your breast with 4 fingers underneath and your thumb above your nipple. Make sure your fingers are well away from your nipple and your baby's mouth.   Stroke your baby's lips gently with your finger or nipple.   When your baby's mouth is open wide enough, quickly bring your baby to your   breast, placing your entire nipple and as much of the colored area around your nipple (areola) as possible into your baby's mouth.   More areola should be visible above your baby's upper lip than below the lower lip.   Your baby's tongue should be between his or her lower gum and your breast.   Ensure that your baby's mouth is correctly positioned  around your nipple (latched). Your baby's lips should create a seal on your breast and be turned out (everted).  It is common for your baby to suck about 2 3 minutes in order to start the flow of breast milk. Latching Teaching your baby how to latch on to your breast properly is very important. An improper latch can cause nipple pain and decreased milk supply for you and poor weight gain in your baby. Also, if your baby is not latched onto your nipple properly, he or she may swallow some air during feeding. This can make your baby fussy. Burping your baby when you switch breasts during the feeding can help to get rid of the air. However, teaching your baby to latch on properly is still the best way to prevent fussiness from swallowing air while breastfeeding. Signs that your baby has successfully latched on to your nipple:    Silent tugging or silent sucking, without causing you pain.   Swallowing heard between every 3 4 sucks.    Muscle movement above and in front of his or her ears while sucking.  Signs that your baby has not successfully latched on to nipple:   Sucking sounds or smacking sounds from your baby while breastfeeding.  Nipple pain. If you think your baby has not latched on correctly, slip your finger into the corner of your baby's mouth to break the suction and place it between your baby's gums. Attempt breastfeeding initiation again. Signs of Successful Breastfeeding Signs from your baby:   A gradual decrease in the number of sucks or complete cessation of sucking.   Falling asleep.   Relaxation of his or her body.   Retention of a small amount of milk in his or her mouth.   Letting go of your breast by himself or herself. Signs from you:  Breasts that have increased in firmness, weight, and size 1 3 hours after feeding.   Breasts that are softer immediately after breastfeeding.  Increased milk volume, as well as a change in milk consistency and color by  the 5th day of breastfeeding.   Nipples that are not sore, cracked, or bleeding. Signs That Your Baby is Getting Enough Milk  Wetting at least 3 diapers in a 24-hour period. The urine should be clear and pale yellow by age 5 days.  At least 3 stools in a 24-hour period by age 5 days. The stool should be soft and yellow.  At least 3 stools in a 24-hour period by age 7 days. The stool should be seedy and yellow.  No loss of weight greater than 10% of birth weight during the first 3 days of age.  Average weight gain of 4 7 ounces (120 210 mL) per week after age 4 days.  Consistent daily weight gain by age 5 days, without weight loss after the age of 2 weeks. After a feeding, your baby may spit up a small amount. This is common. BREASTFEEDING FREQUENCY AND DURATION Frequent feeding will help you make more milk and can prevent sore nipples and breast engorgement. Breastfeed when you feel the need to reduce   the fullness of your breasts or when your baby shows signs of hunger. This is called "breastfeeding on demand." Avoid introducing a pacifier to your baby while you are working to establish breastfeeding (the first 4 6 weeks after your baby is born). After this time you may choose to use a pacifier. Research has shown that pacifier use during the first year of a baby's life decreases the risk of sudden infant death syndrome (SIDS). Allow your baby to feed on each breast as long as he or she wants. Breastfeed until your baby is finished feeding. When your baby unlatches or falls asleep while feeding from the first breast, offer the second breast. Because newborns are often sleepy in the first few weeks of life, you may need to awaken your baby to get him or her to feed. Breastfeeding times will vary from baby to baby. However, the following rules can serve as a guide to help you ensure that your baby is properly fed:  Newborns (babies 4 weeks of age or younger) may breastfeed every 1 3  hours.  Newborns should not go longer than 3 hours during the day or 5 hours during the night without breastfeeding.  You should breastfeed your baby a minimum of 8 times in a 24-hour period until you begin to introduce solid foods to your baby at around 6 months of age. BREAST MILK PUMPING Pumping and storing breast milk allows you to ensure that your baby is exclusively fed your breast milk, even at times when you are unable to breastfeed. This is especially important if you are going back to work while you are still breastfeeding or when you are not able to be present during feedings. Your lactation consultant can give you guidelines on how long it is safe to store breast milk.  A breast pump is a machine that allows you to pump milk from your breast into a sterile bottle. The pumped breast milk can then be stored in a refrigerator or freezer. Some breast pumps are operated by hand, while others use electricity. Ask your lactation consultant which type will work best for you. Breast pumps can be purchased, but some hospitals and breastfeeding support groups lease breast pumps on a monthly basis. A lactation consultant can teach you how to hand express breast milk, if you prefer not to use a pump.  CARING FOR YOUR BREASTS WHILE YOU BREASTFEED Nipples can become dry, cracked, and sore while breastfeeding. The following recommendations can help keep your breasts moisturized and healthy:  Avoid using soap on your nipples.   Wear a supportive bra. Although not required, special nursing bras and tank tops are designed to allow access to your breasts for breastfeeding without taking off your entire bra or top. Avoid wearing underwire style bras or extremely tight bras.  Air dry your nipples for 3 4minutes after each feeding.   Use only cotton bra pads to absorb leaked breast milk. Leaking of breast milk between feedings is normal.   Use lanolin on your nipples after breastfeeding. Lanolin helps to  maintain your skin's normal moisture barrier. If you use pure lanolin you do not need to wash it off before feeding your baby again. Pure lanolin is not toxic to your baby. You may also hand express a few drops of breast milk and gently massage that milk into your nipples and allow the milk to air dry. In the first few weeks after giving birth, some women experience extremely full breasts (engorgement). Engorgement can make   your breasts feel heavy, warm, and tender to the touch. Engorgement peaks within 3 5 days after you give birth. The following recommendations can help ease engorgement:  Completely empty your breasts while breastfeeding or pumping. You may want to start by applying warm, moist heat (in the shower or with warm water-soaked hand towels) just before feeding or pumping. This increases circulation and helps the milk flow. If your baby does not completely empty your breasts while breastfeeding, pump any extra milk after he or she is finished.  Wear a snug bra (nursing or regular) or tank top for 1 2 days to signal your body to slightly decrease milk production.  Apply ice packs to your breasts, unless this is too uncomfortable for you.  Make sure that your baby is latched on and positioned properly while breastfeeding. If engorgement persists after 48 hours of following these recommendations, contact your health care provider or a lactation consultant. OVERALL HEALTH CARE RECOMMENDATIONS WHILE BREASTFEEDING  Eat healthy foods. Alternate between meals and snacks, eating 3 of each per day. Because what you eat affects your breast milk, some of the foods may make your baby more irritable than usual. Avoid eating these foods if you are sure that they are negatively affecting your baby.  Drink milk, fruit juice, and water to satisfy your thirst (about 10 glasses a day).   Rest often, relax, and continue to take your prenatal vitamins to prevent fatigue, stress, and anemia.  Continue  breast self-awareness checks.  Avoid chewing and smoking tobacco.  Avoid alcohol and drug use. Some medicines that may be harmful to your baby can pass through breast milk. It is important to ask your health care provider before taking any medicine, including all over-the-counter and prescription medicine as well as vitamin and herbal supplements. It is possible to become pregnant while breastfeeding. If birth control is desired, ask your health care provider about options that will be safe for your baby. SEEK MEDICAL CARE IF:   You feel like you want to stop breastfeeding or have become frustrated with breastfeeding.  You have painful breasts or nipples.  Your nipples are cracked or bleeding.  Your breasts are red, tender, or warm.  You have a swollen area on either breast.  You have a fever or chills.  You have nausea or vomiting.  You have drainage other than breast milk from your nipples.  Your breasts do not become full before feedings by the 5th day after you give birth.  You feel sad and depressed.  Your baby is too sleepy to eat well.  Your baby is having trouble sleeping.   Your baby is wetting less than 3 diapers in a 24-hour period.  Your baby has less than 3 stools in a 24-hour period.  Your baby's skin or the white part of his or her eyes becomes yellow.   Your baby is not gaining weight by 5 days of age. SEEK IMMEDIATE MEDICAL CARE IF:   Your baby is overly tired (lethargic) and does not want to wake up and feed.  Your baby develops an unexplained fever. Document Released: 11/27/2005 Document Revised: 07/30/2013 Document Reviewed: 05/21/2013 Exit[employer] Patient Information 2014 [employer].  

## 2014-01-01 NOTE — Progress Notes (Signed)
P= 94 Edema in feet.

## 2014-01-02 LAB — GC/CHLAMYDIA PROBE AMP
CT Probe RNA: NEGATIVE
GC Probe RNA: NEGATIVE

## 2014-01-05 ENCOUNTER — Encounter: Payer: Self-pay | Admitting: Family Medicine

## 2014-01-05 DIAGNOSIS — O99820 Streptococcus B carrier state complicating pregnancy: Secondary | ICD-10-CM | POA: Insufficient documentation

## 2014-01-06 ENCOUNTER — Encounter: Payer: Self-pay | Admitting: Family Medicine

## 2014-01-06 ENCOUNTER — Ambulatory Visit (HOSPITAL_COMMUNITY)
Admission: RE | Admit: 2014-01-06 | Discharge: 2014-01-06 | Disposition: A | Discharge status: Home / Self Care | Payer: Medicaid Other | Source: Ambulatory Visit | Attending: Family Medicine | Admitting: Family Medicine

## 2014-01-06 ENCOUNTER — Encounter (HOSPITAL_COMMUNITY): Payer: Self-pay

## 2014-01-06 DIAGNOSIS — O365990 Maternal care for other known or suspected poor fetal growth, unspecified trimester, not applicable or unspecified: Secondary | ICD-10-CM | POA: Insufficient documentation

## 2014-01-06 DIAGNOSIS — J45909 Unspecified asthma, uncomplicated: Secondary | ICD-10-CM | POA: Insufficient documentation

## 2014-01-06 DIAGNOSIS — O99210 Obesity complicating pregnancy, unspecified trimester: Secondary | ICD-10-CM

## 2014-01-06 DIAGNOSIS — O358XX0 Maternal care for other (suspected) fetal abnormality and damage, not applicable or unspecified: Secondary | ICD-10-CM

## 2014-01-06 DIAGNOSIS — O99891 Other specified diseases and conditions complicating pregnancy: Secondary | ICD-10-CM | POA: Insufficient documentation

## 2014-01-06 DIAGNOSIS — E669 Obesity, unspecified: Secondary | ICD-10-CM | POA: Insufficient documentation

## 2014-01-06 DIAGNOSIS — O9989 Other specified diseases and conditions complicating pregnancy, childbirth and the puerperium: Secondary | ICD-10-CM

## 2014-01-07 ENCOUNTER — Encounter: Payer: Self-pay | Admitting: Family Medicine

## 2014-01-07 LAB — CULTURE, BETA STREP (GROUP B ONLY)

## 2014-01-08 ENCOUNTER — Encounter: Payer: Self-pay | Admitting: Obstetrics and Gynecology

## 2014-01-08 ENCOUNTER — Ambulatory Visit (INDEPENDENT_AMBULATORY_CARE_PROVIDER_SITE_OTHER): Payer: Medicaid Other | Admitting: Obstetrics and Gynecology

## 2014-01-08 ENCOUNTER — Other Ambulatory Visit: Payer: Self-pay | Admitting: Obstetrics and Gynecology

## 2014-01-08 VITALS — BP 121/72 | Temp 97.4°F | Wt 222.8 lb

## 2014-01-08 DIAGNOSIS — O365990 Maternal care for other known or suspected poor fetal growth, unspecified trimester, not applicable or unspecified: Secondary | ICD-10-CM

## 2014-01-08 DIAGNOSIS — E669 Obesity, unspecified: Secondary | ICD-10-CM

## 2014-01-08 DIAGNOSIS — O99820 Streptococcus B carrier state complicating pregnancy: Secondary | ICD-10-CM

## 2014-01-08 DIAGNOSIS — O9989 Other specified diseases and conditions complicating pregnancy, childbirth and the puerperium: Secondary | ICD-10-CM

## 2014-01-08 DIAGNOSIS — O09899 Supervision of other high risk pregnancies, unspecified trimester: Secondary | ICD-10-CM

## 2014-01-08 DIAGNOSIS — O99210 Obesity complicating pregnancy, unspecified trimester: Secondary | ICD-10-CM

## 2014-01-08 DIAGNOSIS — O2610 Low weight gain in pregnancy, unspecified trimester: Secondary | ICD-10-CM

## 2014-01-08 DIAGNOSIS — Z3490 Encounter for supervision of normal pregnancy, unspecified, unspecified trimester: Secondary | ICD-10-CM

## 2014-01-08 DIAGNOSIS — Z22330 Carrier of Group B streptococcus: Secondary | ICD-10-CM

## 2014-01-08 LAB — POCT URINALYSIS DIP (DEVICE)
Glucose, UA: NEGATIVE mg/dL
HGB URINE DIPSTICK: NEGATIVE
Ketones, ur: NEGATIVE mg/dL
LEUKOCYTES UA: NEGATIVE
NITRITE: NEGATIVE
PH: 7 (ref 5.0–8.0)
PROTEIN: 30 mg/dL — AB
Specific Gravity, Urine: 1.025 (ref 1.005–1.030)
UROBILINOGEN UA: 0.2 mg/dL (ref 0.0–1.0)

## 2014-01-08 NOTE — Progress Notes (Signed)
Pulse- 94  Edema-feet, face  Pressure-vaginal

## 2014-01-08 NOTE — Progress Notes (Signed)
Patient is doing well, reviewed normal 3rd trimester discomforts, FM/labor precautions reviewed. Reviewed 1/27 ultrasound. Will schedule patient for IOL at 40 weeks on 2/18

## 2014-01-08 NOTE — Progress Notes (Signed)
Will call next week and schedule IOL as its is too early [facility].

## 2014-01-13 ENCOUNTER — Other Ambulatory Visit: Payer: Self-pay | Admitting: Family Medicine

## 2014-01-13 ENCOUNTER — Ambulatory Visit (HOSPITAL_COMMUNITY)
Admission: RE | Admit: 2014-01-13 | Discharge: 2014-01-13 | Disposition: A | Discharge status: Home / Self Care | Payer: Medicaid Other | Source: Ambulatory Visit | Attending: Family Medicine | Admitting: Family Medicine

## 2014-01-13 DIAGNOSIS — O9989 Other specified diseases and conditions complicating pregnancy, childbirth and the puerperium: Secondary | ICD-10-CM

## 2014-01-13 DIAGNOSIS — O365990 Maternal care for other known or suspected poor fetal growth, unspecified trimester, not applicable or unspecified: Secondary | ICD-10-CM | POA: Insufficient documentation

## 2014-01-13 DIAGNOSIS — J45909 Unspecified asthma, uncomplicated: Secondary | ICD-10-CM

## 2014-01-13 DIAGNOSIS — O99210 Obesity complicating pregnancy, unspecified trimester: Secondary | ICD-10-CM

## 2014-01-13 DIAGNOSIS — E669 Obesity, unspecified: Secondary | ICD-10-CM

## 2014-01-13 DIAGNOSIS — O99891 Other specified diseases and conditions complicating pregnancy: Secondary | ICD-10-CM | POA: Insufficient documentation

## 2014-01-15 ENCOUNTER — Ambulatory Visit (INDEPENDENT_AMBULATORY_CARE_PROVIDER_SITE_OTHER): Payer: Medicaid Other | Admitting: Advanced Practice Midwife

## 2014-01-15 VITALS — BP 119/72 | Temp 98.0°F | Wt 225.1 lb

## 2014-01-15 DIAGNOSIS — O9989 Other specified diseases and conditions complicating pregnancy, childbirth and the puerperium: Secondary | ICD-10-CM

## 2014-01-15 DIAGNOSIS — O2610 Low weight gain in pregnancy, unspecified trimester: Secondary | ICD-10-CM

## 2014-01-15 LAB — POCT URINALYSIS DIP (DEVICE)
BILIRUBIN URINE: NEGATIVE
Glucose, UA: NEGATIVE mg/dL
Hgb urine dipstick: NEGATIVE
KETONES UR: NEGATIVE mg/dL
LEUKOCYTES UA: NEGATIVE
Nitrite: NEGATIVE
Protein, ur: NEGATIVE mg/dL
Specific Gravity, Urine: 1.02 (ref 1.005–1.030)
Urobilinogen, UA: 0.2 mg/dL (ref 0.0–1.0)
pH: 7.5 (ref 5.0–8.0)

## 2014-01-15 NOTE — Progress Notes (Signed)
Pulse- 89 Patients reports stiffness in legs; patient reports pelvic & back pain/pressure

## 2014-01-15 NOTE — Progress Notes (Signed)
Doing well Next US scheduled on 10th.  [facility] Will do scheduling next week for around the 18th

## 2014-01-20 ENCOUNTER — Other Ambulatory Visit: Payer: Self-pay | Admitting: Family Medicine

## 2014-01-20 ENCOUNTER — Inpatient Hospital Stay (HOSPITAL_COMMUNITY)
Admission: AD | Admit: 2014-01-20 | Discharge: 2014-01-23 | DRG: 775 | Disposition: A | Discharge status: Home / Self Care | Payer: Medicaid Other | Source: Ambulatory Visit | Attending: Obstetrics & Gynecology | Admitting: Obstetrics & Gynecology

## 2014-01-20 ENCOUNTER — Encounter (HOSPITAL_COMMUNITY): Payer: Self-pay | Admitting: *Deleted

## 2014-01-20 ENCOUNTER — Ambulatory Visit (HOSPITAL_COMMUNITY)
Admission: RE | Admit: 2014-01-20 | Discharge: 2014-01-20 | Disposition: A | Discharge status: Home / Self Care | Payer: Medicaid Other | Source: Ambulatory Visit | Attending: Family Medicine | Admitting: Family Medicine

## 2014-01-20 VITALS — BP 119/70 | Wt 225.0 lb

## 2014-01-20 DIAGNOSIS — O9989 Other specified diseases and conditions complicating pregnancy, childbirth and the puerperium: Secondary | ICD-10-CM

## 2014-01-20 DIAGNOSIS — O99820 Streptococcus B carrier state complicating pregnancy: Secondary | ICD-10-CM

## 2014-01-20 DIAGNOSIS — O99892 Other specified diseases and conditions complicating childbirth: Secondary | ICD-10-CM | POA: Diagnosis present

## 2014-01-20 DIAGNOSIS — E669 Obesity, unspecified: Secondary | ICD-10-CM

## 2014-01-20 DIAGNOSIS — O365990 Maternal care for other known or suspected poor fetal growth, unspecified trimester, not applicable or unspecified: Principal | ICD-10-CM | POA: Diagnosis present

## 2014-01-20 DIAGNOSIS — J45909 Unspecified asthma, uncomplicated: Secondary | ICD-10-CM | POA: Diagnosis present

## 2014-01-20 DIAGNOSIS — Z22330 Carrier of Group B streptococcus: Secondary | ICD-10-CM

## 2014-01-20 DIAGNOSIS — O99214 Obesity complicating childbirth: Secondary | ICD-10-CM

## 2014-01-20 DIAGNOSIS — IMO0002 Reserved for concepts with insufficient information to code with codable children: Secondary | ICD-10-CM | POA: Diagnosis present

## 2014-01-20 DIAGNOSIS — O99210 Obesity complicating pregnancy, unspecified trimester: Secondary | ICD-10-CM

## 2014-01-20 HISTORY — DX: Dysmenorrhea, unspecified: N94.6

## 2014-01-20 LAB — CBC
HCT: 33.9 % — ABNORMAL LOW (ref 36.0–46.0)
Hemoglobin: 11.5 g/dL — ABNORMAL LOW (ref 12.0–15.0)
MCH: 27.7 pg (ref 26.0–34.0)
MCHC: 33.9 g/dL (ref 30.0–36.0)
MCV: 81.7 fL (ref 78.0–100.0)
Platelets: 256 10*3/uL (ref 150–400)
RBC: 4.15 MIL/uL (ref 3.87–5.11)
RDW: 14.4 % (ref 11.5–15.5)
WBC: 11.5 10*3/uL — ABNORMAL HIGH (ref 4.0–10.5)

## 2014-01-20 LAB — TYPE AND SCREEN
ABO/RH(D): O POS
Antibody Screen: NEGATIVE

## 2014-01-20 LAB — RPR: RPR Ser Ql: NONREACTIVE

## 2014-01-20 LAB — ABO/RH: ABO/RH(D): O POS

## 2014-01-20 MED ORDER — LACTATED RINGERS IV SOLN
500.0000 mL | INTRAVENOUS | Status: DC | PRN
  Administered 2014-01-21 (×2): 500 mL via INTRAVENOUS

## 2014-01-20 MED ORDER — LIDOCAINE HCL (PF) 1 % IJ SOLN
30.0000 mL | INTRAMUSCULAR | Status: DC | PRN
  Filled 2014-01-20: qty 30

## 2014-01-20 MED ORDER — CITRIC ACID-SODIUM CITRATE 334-500 MG/5ML PO SOLN: 30.0000 mL | ORAL | Status: DC | PRN

## 2014-01-20 MED ORDER — CEFAZOLIN SODIUM-DEXTROSE 2-3 GM-% IV SOLR
2.0000 g | Freq: Once | INTRAVENOUS | Status: DC
  Filled 2014-01-20: qty 50

## 2014-01-20 MED ORDER — CEFAZOLIN SODIUM 1-5 GM-% IV SOLN
1.0000 g | Freq: Three times a day (TID) | INTRAVENOUS | Status: DC
  Filled 2014-01-20: qty 50

## 2014-01-20 MED ORDER — OXYCODONE-ACETAMINOPHEN 5-325 MG PO TABS: 1.0000 | ORAL_TABLET | ORAL | Status: DC | PRN

## 2014-01-20 MED ORDER — OXYTOCIN BOLUS FROM INFUSION
500.0000 mL | INTRAVENOUS | Status: DC
  Administered 2014-01-21: 500 mL via INTRAVENOUS

## 2014-01-20 MED ORDER — ONDANSETRON HCL 4 MG/2ML IJ SOLN
4.0000 mg | Freq: Four times a day (QID) | INTRAMUSCULAR | Status: DC | PRN
  Administered 2014-01-21: 4 mg via INTRAVENOUS
  Filled 2014-01-20: qty 2

## 2014-01-20 MED ORDER — LACTATED RINGERS IV SOLN
INTRAVENOUS | Status: DC
  Administered 2014-01-21 (×2): via INTRAVENOUS

## 2014-01-20 MED ORDER — ALBUTEROL SULFATE (2.5 MG/3ML) 0.083% IN NEBU
3.0000 mL | INHALATION_SOLUTION | RESPIRATORY_TRACT | Status: DC | PRN
  Administered 2014-01-20 – 2014-01-21 (×2): 3 mL via RESPIRATORY_TRACT
  Filled 2014-01-20 (×2): qty 3

## 2014-01-20 MED ORDER — IBUPROFEN 600 MG PO TABS
600.0000 mg | ORAL_TABLET | Freq: Four times a day (QID) | ORAL | Status: DC | PRN
  Administered 2014-01-21: 600 mg via ORAL
  Filled 2014-01-20: qty 1

## 2014-01-20 MED ORDER — ACETAMINOPHEN 325 MG PO TABS: 650.0000 mg | ORAL_TABLET | ORAL | Status: DC | PRN

## 2014-01-20 MED ORDER — CEFAZOLIN SODIUM 1-5 GM-% IV SOLN
1.0000 g | Freq: Three times a day (TID) | INTRAVENOUS | Status: DC
  Administered 2014-01-21 (×2): 1 g via INTRAVENOUS
  Filled 2014-01-20 (×4): qty 50

## 2014-01-20 MED ORDER — OXYTOCIN 40 UNITS IN LACTATED RINGERS INFUSION - SIMPLE MED
62.5000 mL/h | INTRAVENOUS | Status: DC
Start: 2014-01-20 — End: 2014-01-21

## 2014-01-20 NOTE — H&P (Signed)
Jade Clark is a 23 y.o. female presenting for IOL for IUGR as seen on 01/20/14 US.   Maternal Medical History:  Reason for admission: Nausea.   Mr. Jade Clark is a 23 y.o. G2P0010 at 3325w6d as determined by LMP and c/w US.  She has had an uneventful pregnancy with prenatal care in [place]. She denies vaginal bleeding and LOF, and reports good fetal movement. She reports contractions 2-3 times per day that began on Sunday (01/18/14). She plans for fentanyl and epidural for pain control. She wishes to bottle feed. She denies HTN during pregnancy and passed her 3 hr GTT (failed 1 hr). She has a history of asthma that has improved during pregnancy.  She reports HA during pregnancy, possibly due to dehydration, SOB and cough relieved by albuterol.  Medications during pregnancy include tylenol, phenergen, and albuterol. Allergies: amoxicillin (hive reaction during childhood)  Medical history significant for asthma, relieved by albuterol used once per day. Family history significant for diabetes (Father, PGM).  SH: denies tobacco use, alcohol use, illicit drug use during pregnancy. Student at [employer]. Family in [place]. Lives with FOB.  OB History   Grav Para Term Preterm Abortions TAB SAB Ect Mult Living   2    1  1         Past Medical History  Diagnosis Date  . Asthma   . Dysmenorrhea    Past Surgical History  Procedure Laterality Date  . Tonsillectomy     Family History: family history includes Diabetes in her father and paternal grandmother; Glaucoma in her mother; Sarcoidosis in her mother; Stroke in her father. Social History:  reports that she has never smoked. She has never used smokeless tobacco. She reports that she does not drink alcohol or use illicit drugs.   Prenatal Transfer Tool  Maternal Diabetes: No Genetic Screening: Normal Maternal Ultrasounds/Referrals: Abnormal:  Findings:   IUGR Fetal Ultrasounds or other Referrals:  None Maternal Substance Abuse:  No Significant Maternal  Medications:  Meds include: Other: tylenol, phenergan, albuterol  Significant Maternal Lab Results:  None Other Comments:  None  Review of Systems  Constitutional: Negative for fever, chills and weight loss.  HENT: Negative for congestion and sore throat.   Eyes: Negative for blurred vision and double vision.       Reports floaters during pregnancy, relieved with glasses  Cardiovascular: Positive for leg swelling (feet and ankles during 3rd trimester). Negative for chest pain and palpitations.  Gastrointestinal: Positive for heartburn and abdominal pain (lower abdominal cramping, contractions). Negative for nausea, vomiting, diarrhea, constipation and blood in stool.  Genitourinary: Negative for dysuria and urgency.  Musculoskeletal: Negative for myalgias.  Neurological: Positive for tingling (tingling in extremities during 3rd trimester) and headaches (possible dehydration). Negative for dizziness and weakness.    Dilation: 1 Effacement (%): 60 Station: -3 Exam by:: Dr. Richarda BladeAdamo Blood pressure 113/72, pulse 106, temperature 98.3 F (36.8 C), temperature source Oral, resp. rate 18, height 5\' 3"  (1.6 m), weight 102.059 kg (225 lb). Exam Physical Exam  Constitutional: She is oriented to person, place, and time. She appears well-developed and well-nourished. No distress.  Eyes: EOM are normal.  Neck: Normal range of motion. No JVD present.  Cardiovascular: Normal rate and regular rhythm.   Respiratory: Effort normal and breath sounds normal. She has no wheezes.  GI: Soft. Bowel sounds are normal. There is no tenderness. There is no rebound and no guarding.  Musculoskeletal: She exhibits edema (mild edema BLE). She exhibits no tenderness.  Neurological: She  is alert and oriented to person, place, and time.  Skin: Skin is warm and dry.  Psychiatric: She has a normal mood and affect.    Prenatal labs: ABO, Rh: O/POS/-- (08/25 1117) Antibody: NEG (08/25 1117) Rubella: 3.07 (08/25  1117) RPR: NON REAC (08/25 1117)  HBsAg: NEGATIVE (08/25 1117)  HIV: NON REACTIVE (08/25 1117)  GBS: Positive (01/22 0000)    Assessment/Plan: Jade Clark G2P0010 [redacted]w[redacted]d presents for IOL following [place] that revealed IUGR. Foley bulb placed at 17:15.  Albuterol nebulizer treatment at approximately 17:00 relieved SOB.  She is GBS positive and will receive ancef after foley bulb comes out.   Duane Boston 01/20/2014, 5:18 PM  I agree with the medical student note above and have edited it as necessary. I formulated the plan and performed my own physical exam which are documented above.  Beverely Low, MD, MPH [facility] PGY-1 01/20/2014 6:11 PM   I examined pt and agree with documentation above and resident plan of care. [facility]

## 2014-01-20 NOTE — Progress Notes (Signed)
Called to give Jade Clark a breathing treatment.  [facility] I was called and told patient didn't need treatment after all.  I still checked on patient.  She stated her room had gotten very hot and that had caused her breathing trouble.  On assessment, patient in NAD.  Breath sounds equal and clear with good aeration.  No wheezes heard.  Informed patient if she needed a treatment at any time, let RN know so she could call me.

## 2014-01-20 NOTE — Progress Notes (Signed)
Jade Clark is a 23 y.o. G2P0010 at 1371w6d.  Subjective: Cramping  Objective: BP 128/76  Pulse 70  Temp(Src) 98.3 F (36.8 C) (Oral)  Resp 18  Ht 5\' 3"  (1.6 m)  Wt 102.059 kg (225 lb)  BMI 39.87 kg/m2      FHT:  FHR: 130 bpm, variability: moderate,  accelerations:  Present,  decelerations:  Absent UC:   irregular, mild SVE:   Dilation: 3 around foley bulb Exam by:: RN  Labs: Lab Results  Component Value Date   WBC 11.5* 01/20/2014   HGB 11.5* 01/20/2014   HCT 33.9* 01/20/2014   MCV 81.7 01/20/2014   PLT 256 01/20/2014    Assessment / Plan: Induction of labor due to IUGR,  progressing well on pitocin  Labor: Progressing normally Preeclampsia:  NA Fetal Wellbeing:  Category I Pain Control:  Labor support without medications I/D:  n/a Anticipated MOD:  NSVD  Lois Ostrom 01/20/2014, 9:57 PM

## 2014-01-21 ENCOUNTER — Encounter (HOSPITAL_COMMUNITY): Payer: Medicaid Other | Admitting: Anesthesiology

## 2014-01-21 ENCOUNTER — Inpatient Hospital Stay (HOSPITAL_COMMUNITY): Payer: Medicaid Other | Admitting: Anesthesiology

## 2014-01-21 ENCOUNTER — Encounter (HOSPITAL_COMMUNITY): Payer: Self-pay | Admitting: Family Medicine

## 2014-01-21 DIAGNOSIS — O9989 Other specified diseases and conditions complicating pregnancy, childbirth and the puerperium: Secondary | ICD-10-CM

## 2014-01-21 DIAGNOSIS — O99892 Other specified diseases and conditions complicating childbirth: Secondary | ICD-10-CM

## 2014-01-21 DIAGNOSIS — O365990 Maternal care for other known or suspected poor fetal growth, unspecified trimester, not applicable or unspecified: Secondary | ICD-10-CM

## 2014-01-21 DIAGNOSIS — J45909 Unspecified asthma, uncomplicated: Secondary | ICD-10-CM

## 2014-01-21 LAB — CBC
HCT: 33.2 % — ABNORMAL LOW (ref 36.0–46.0)
HEMOGLOBIN: 11.2 g/dL — AB (ref 12.0–15.0)
MCH: 27.6 pg (ref 26.0–34.0)
MCHC: 33.7 g/dL (ref 30.0–36.0)
MCV: 81.8 fL (ref 78.0–100.0)
Platelets: 227 10*3/uL (ref 150–400)
RBC: 4.06 MIL/uL (ref 3.87–5.11)
RDW: 14.4 % (ref 11.5–15.5)
WBC: 11.1 10*3/uL — ABNORMAL HIGH (ref 4.0–10.5)

## 2014-01-21 MED ORDER — DIBUCAINE 1 % RE OINT: 1.0000 "application " | TOPICAL_OINTMENT | RECTAL | Status: DC | PRN

## 2014-01-21 MED ORDER — DIPHENHYDRAMINE HCL 50 MG/ML IJ SOLN: 12.5000 mg | INTRAMUSCULAR | Status: DC | PRN

## 2014-01-21 MED ORDER — OXYTOCIN 40 UNITS IN LACTATED RINGERS INFUSION - SIMPLE MED
1.0000 m[IU]/min | INTRAVENOUS | Status: DC
  Administered 2014-01-21: 2 m[IU]/min via INTRAVENOUS
  Filled 2014-01-21: qty 1000

## 2014-01-21 MED ORDER — LANOLIN HYDROUS EX OINT: TOPICAL_OINTMENT | CUTANEOUS | Status: DC | PRN

## 2014-01-21 MED ORDER — WITCH HAZEL-GLYCERIN EX PADS: 1.0000 "application " | MEDICATED_PAD | CUTANEOUS | Status: DC | PRN

## 2014-01-21 MED ORDER — ONDANSETRON HCL 4 MG/2ML IJ SOLN: 4.0000 mg | INTRAMUSCULAR | Status: DC | PRN

## 2014-01-21 MED ORDER — SENNOSIDES-DOCUSATE SODIUM 8.6-50 MG PO TABS
2.0000 | ORAL_TABLET | ORAL | Status: DC
Start: 2014-01-22 — End: 2014-01-23
  Administered 2014-01-22 – 2014-01-23 (×2): 2 via ORAL
  Filled 2014-01-21 (×2): qty 2

## 2014-01-21 MED ORDER — SIMETHICONE 80 MG PO CHEW: 80.0000 mg | CHEWABLE_TABLET | ORAL | Status: DC | PRN

## 2014-01-21 MED ORDER — TERBUTALINE SULFATE 1 MG/ML IJ SOLN: 0.2500 mg | Freq: Once | INTRAMUSCULAR | Status: DC | PRN

## 2014-01-21 MED ORDER — LIDOCAINE HCL (PF) 1 % IJ SOLN
INTRAMUSCULAR | Status: DC | PRN
  Administered 2014-01-21 (×2): 5 mL

## 2014-01-21 MED ORDER — ONDANSETRON HCL 4 MG PO TABS: 4.0000 mg | ORAL_TABLET | ORAL | Status: DC | PRN

## 2014-01-21 MED ORDER — FENTANYL 2.5 MCG/ML BUPIVACAINE 1/10 % EPIDURAL INFUSION (WH - ANES)
14.0000 mL/h | INTRAMUSCULAR | Status: DC | PRN
  Administered 2014-01-21: 14 mL/h via EPIDURAL
  Filled 2014-01-21: qty 125

## 2014-01-21 MED ORDER — EPHEDRINE 5 MG/ML INJ
10.0000 mg | INTRAVENOUS | Status: DC | PRN
  Filled 2014-01-21: qty 2
  Filled 2014-01-21: qty 4

## 2014-01-21 MED ORDER — PHENYLEPHRINE 40 MCG/ML (10ML) SYRINGE FOR IV PUSH (FOR BLOOD PRESSURE SUPPORT)
80.0000 ug | PREFILLED_SYRINGE | INTRAVENOUS | Status: DC | PRN
  Filled 2014-01-21: qty 2
  Filled 2014-01-21: qty 10

## 2014-01-21 MED ORDER — ZOLPIDEM TARTRATE 5 MG PO TABS: 5.0000 mg | ORAL_TABLET | Freq: Every evening | ORAL | Status: DC | PRN

## 2014-01-21 MED ORDER — LACTATED RINGERS IV SOLN
500.0000 mL | Freq: Once | INTRAVENOUS | Status: AC
  Administered 2014-01-21: 500 mL via INTRAVENOUS

## 2014-01-21 MED ORDER — PRENATAL MULTIVITAMIN CH
1.0000 | ORAL_TABLET | Freq: Every day | ORAL | Status: DC
  Administered 2014-01-22 – 2014-01-23 (×2): 1 via ORAL
  Filled 2014-01-21 (×2): qty 1

## 2014-01-21 MED ORDER — OXYCODONE-ACETAMINOPHEN 5-325 MG PO TABS
1.0000 | ORAL_TABLET | ORAL | Status: DC | PRN
  Administered 2014-01-22 (×2): 1 via ORAL
  Filled 2014-01-21 (×2): qty 1

## 2014-01-21 MED ORDER — BENZOCAINE-MENTHOL 20-0.5 % EX AERO
1.0000 "application " | INHALATION_SPRAY | CUTANEOUS | Status: DC | PRN
  Filled 2014-01-21: qty 56

## 2014-01-21 MED ORDER — PHENYLEPHRINE 40 MCG/ML (10ML) SYRINGE FOR IV PUSH (FOR BLOOD PRESSURE SUPPORT)
80.0000 ug | PREFILLED_SYRINGE | INTRAVENOUS | Status: DC | PRN
  Filled 2014-01-21: qty 2

## 2014-01-21 MED ORDER — EPHEDRINE 5 MG/ML INJ
10.0000 mg | INTRAVENOUS | Status: DC | PRN
  Filled 2014-01-21: qty 2

## 2014-01-21 MED ORDER — IBUPROFEN 600 MG PO TABS
600.0000 mg | ORAL_TABLET | Freq: Four times a day (QID) | ORAL | Status: DC
  Administered 2014-01-22 – 2014-01-23 (×6): 600 mg via ORAL
  Filled 2014-01-21 (×6): qty 1

## 2014-01-21 MED ORDER — FENTANYL CITRATE 0.05 MG/ML IJ SOLN
100.0000 ug | INTRAMUSCULAR | Status: DC | PRN
  Administered 2014-01-21: 100 ug via INTRAVENOUS
  Filled 2014-01-21: qty 2

## 2014-01-21 MED ORDER — DIPHENHYDRAMINE HCL 25 MG PO CAPS: 25.0000 mg | ORAL_CAPSULE | Freq: Four times a day (QID) | ORAL | Status: DC | PRN

## 2014-01-21 MED ORDER — TETANUS-DIPHTH-ACELL PERTUSSIS 5-2.5-18.5 LF-MCG/0.5 IM SUSP: 0.5000 mL | Freq: Once | INTRAMUSCULAR | Status: DC

## 2014-01-21 NOTE — Progress Notes (Signed)
Jade Clark is a 23 y.o. G2P0010 at 6432w0d  admitted for IOL for IUGR  Subjective:  Doing well. Comfortable after epidural. +FM.    Objective: BP 114/68  Pulse 94  Temp(Src) 98.3 F (36.8 C) (Oral)  Resp 18  Ht 5\' 3"  (1.6 m)  Wt 102.059 kg (225 lb)  BMI 39.87 kg/m2  SpO2 100%      FHT:  FHR: 135 bpm, variability: moderate,  accelerations:  Present,  decelerations:  Absent UC:   regular, every 1-2 minutes SVE:   Dilation: 5.5 Effacement (%): 80 Station: -1 Exam by:: Jade MooreM. Causey, RN  Labs: Lab Results  Component Value Date   WBC 11.1* 01/21/2014   HGB 11.2* 01/21/2014   HCT 33.2* 01/21/2014   MCV 81.8 01/21/2014   PLT 227 01/21/2014    Assessment / Plan: IOL due to IUGR on 20 mu/min of pit and s/p AROM  Labor: progressing on pitocin. IUPC placed b/c of difficulyt tracing contractions.  no difficulty Fetal Wellbeing:  Category I Pain Control:  Epidural I/D:  gbs pos and on ancef Anticipated MOD:  NSVD  Prayan Ulin L 01/21/2014, 3:35 PM

## 2014-01-21 NOTE — Progress Notes (Signed)
Dr. Richarda BladeAdamo notified of tachysystole. Pitocin cut in half to 10milliunits. Will continue to monitor.

## 2014-01-21 NOTE — Anesthesia Preprocedure Evaluation (Signed)
Anesthesia Evaluation  Patient identified by MRN, date of birth, ID band Patient awake    Reviewed: Allergy & Precautions, H&P , Patient's Chart, lab work & pertinent test results  Airway Mallampati: III  TM Distance: >3 FB Neck ROM: full    Dental   Pulmonary  breath sounds clear to auscultation        Cardiovascular Rhythm:regular Rate:Normal     Neuro/Psych    GI/Hepatic   Endo/Other  Morbid obesity  Renal/GU      Musculoskeletal   Abdominal   Peds  Hematology   Anesthesia Other Findings   Reproductive/Obstetrics (+) Pregnancy                             Anesthesia Physical Anesthesia Plan  ASA: III  Anesthesia Plan: Epidural   Post-op Pain Management:    Induction:   Airway Management Planned:   Additional Equipment:   Intra-op Plan:   Post-operative Plan:   Informed Consent: I have reviewed the patients History and Physical, chart, labs and discussed the procedure including the risks, benefits and alternatives for the proposed anesthesia with the patient or authorized representative who has indicated his/her understanding and acceptance.     Plan Discussed with:   Anesthesia Plan Comments:         Anesthesia Quick Evaluation  

## 2014-01-21 NOTE — Progress Notes (Signed)
Marylu LundLaquita Ikard is a 23 y.o. G2P0010 at 216w0d  admitted for IOL for IUGR.  Subjective:  Pt starting to feel contractions. In a more regular pattern.  +FM.   Objective: BP 101/80  Pulse 80  Temp(Src) 98.4 F (36.9 C) (Oral)  Resp 18  Ht 5\' 3"  (1.6 m)  Wt 102.059 kg (225 lb)  BMI 39.87 kg/m2  SpO2 99%      FHT:  FHR: 135 bpm, variability: moderate,  accelerations:  Present,  decelerations:  Absent UC:   regular, every 2-4 minutes SVE:   Dilation: 4.5 Effacement (%): 50 Station: -2 Exam by:: Dr. Reola CalkinsBeck   Labs: Lab Results  Component Value Date   WBC 11.5* 01/20/2014   HGB 11.5* 01/20/2014   HCT 33.9* 01/20/2014   MCV 81.7 01/20/2014   PLT 256 01/20/2014    Assessment / Plan: Induction of labor due to IUGR,  progressing well on pitocin  Labor: progressing on pitocin. AROM performed with return of clear fluid  Fetal Wellbeing:  Category I Pain Control:  Labor support without medications I/D:  GBS + and on ancef Anticipated MOD:  NSVD  Zedric Deroy L 01/21/2014, 12:59 PM

## 2014-01-21 NOTE — H&P (Signed)
Attestation of Attending Supervision of Advanced Practitioner (CNM/NP): Evaluation and management procedures were performed by the Advanced Practitioner under my supervision and collaboration. I have reviewed the Advanced Practitioner's note and chart, and I agree with the management and plan.  Natthew Marlatt H. 6:52 AM

## 2014-01-21 NOTE — Progress Notes (Signed)
Marylu LundLaquita Nguyen is a 23 y.o. G2P0010 at 8431w0d.  Subjective: Mild cramping.  Objective: BP 117/81  Pulse 98  Temp(Src) 98.3 F (36.8 C) (Oral)  Resp 16  Ht 5\' 3"  (1.6 m)  Wt 102.059 kg (225 lb)  BMI 39.87 kg/m2  SpO2 99%      FHT:  FHR: 130 bpm, variability: moderate,  accelerations:  Present,  decelerations:  Absent UC:   rare SVE:   Dilation: 4.5 Effacement (%): 20 Station: -3 Exam by:: V.Jozlyn Schatz,CNM Foley bulb out.   Labs: Lab Results  Component Value Date   WBC 11.5* 01/20/2014   HGB 11.5* 01/20/2014   HCT 33.9* 01/20/2014   MCV 81.7 01/20/2014   PLT 256 01/20/2014    Assessment / Plan: Induction of labor due to IUGR,  progressing well on pitocin  Labor: Progressing normally and will start pitocin. Preeclampsia:  NA Fetal Wellbeing:  Category I Pain Control:  Epidural I/D:  n/a Anticipated MOD:  NSVD  Chia Mowers 01/21/2014, 5:53 AM

## 2014-01-21 NOTE — Progress Notes (Signed)
Jade Clark is a [age] y.o. G2P0010 at 363w0d.  Subjective: Mild cramping.  Objective: BP 108/58  Pulse 97  Temp(Src) 97.6 F (36.4 C) (Oral)  Resp 18  Ht 5\' 3"  (1.6 m)  Wt 102.059 kg (225 lb)  BMI 39.87 kg/m2  SpO2 99%      FHT:  FHR: 120 bpm, variability: moderate,  accelerations:  Present,  decelerations:  Absent UC:   Irregular, mild SVE:  Foley bulb still in. Cervix feels 3 cm around it.   Labs: Lab Results  Component Value Date   WBC 11.5* 01/20/2014   HGB 11.5* 01/20/2014   HCT 33.9* 01/20/2014   MCV 81.7 01/20/2014   PLT 256 01/20/2014    Assessment / Plan: Induction of labor due to IUGR,  Progressing normally.   Labor: Progressing normally Preeclampsia:  NA  Fetal Wellbeing:  Category I Pain Control:  Labor support without medications I/D:  n/a Anticipated MOD:  NSVD  Jade Clark 01/21/2014, 3:03 AM

## 2014-01-21 NOTE — Anesthesia Procedure Notes (Signed)
Epidural Patient location during procedure: OB Start time: 01/21/2014 2:52 PM  Staffing Anesthesiologist: Brayton CavesJACKSON, Skyelyn Scruggs Performed by: anesthesiologist   Preanesthetic Checklist Completed: patient identified, site marked, surgical consent, pre-op evaluation, timeout performed, IV checked, risks and benefits discussed and monitors and equipment checked  Epidural Patient position: sitting Prep: site prepped and draped and DuraPrep Patient monitoring: continuous pulse ox and blood pressure Approach: midline Injection technique: LOR air  Needle:  Needle type: Tuohy  Needle gauge: 17 G Needle length: 9 cm and 9 Needle insertion depth: 7 cm Catheter type: closed end flexible Catheter size: 19 Gauge Catheter at skin depth: 12 cm Test dose: negative  Assessment Events: blood not aspirated, injection not painful, no injection resistance, negative IV test and no paresthesia  Additional Notes Patient identified.  Risk benefits discussed including failed block, incomplete pain control, headache, nerve damage, paralysis, blood pressure changes, nausea, vomiting, reactions to medication both toxic or allergic, and postpartum back pain.  Patient expressed understanding and wished to proceed.  All questions were answered.  Sterile technique used throughout procedure and epidural site dressed with sterile barrier dressing. No paresthesia or other complications noted.The patient did not experience any signs of intravascular injection such as tinnitus or metallic taste in mouth nor signs of intrathecal spread such as rapid motor block. Please see nursing notes for vital signs.

## 2014-01-22 ENCOUNTER — Encounter: Payer: Medicaid Other | Admitting: Obstetrics & Gynecology

## 2014-01-22 MED ORDER — PNEUMOCOCCAL VAC POLYVALENT 25 MCG/0.5ML IJ INJ
0.5000 mL | INJECTION | INTRAMUSCULAR | Status: DC
  Filled 2014-01-22: qty 0.5

## 2014-01-22 NOTE — Progress Notes (Signed)
UR chart review completed.  

## 2014-01-22 NOTE — Anesthesia Postprocedure Evaluation (Signed)
  Anesthesia Post-op Note  Anesthesia Post Note  Patient: Jade Clark  Procedure(s) Performed: * No procedures listed *  Anesthesia type: Epidural  Patient location: Mother/Baby  Post pain: Pain level controlled  Post assessment: Post-op Vital signs reviewed  Last Vitals:  Filed Vitals:   01/22/14 0555  BP: 106/68  Pulse: 78  Temp: 37 C  Resp: 18    Post vital signs: Reviewed  Level of consciousness:alert  Complications: No apparent anesthesia complications

## 2014-01-22 NOTE — Progress Notes (Signed)
Post Partum Day 1 Subjective: Jade Clark is a 23 y.o. [id number] PPD#1 s/p NSVD at 4119w0d. She reports that she is dong well this morning with only c/o mild vaginal irritation. She was given Motrin, Percocet, and Dermoplast topical spray this AM which she says has been effective. She reports minimal vaginal bleeding, ambulates well, voiding, positive flatus, and has had a BM. She is currently bottle feeding, plans for OP circumcision for her baby boy, and Nexplanon for contraception. Denies N/V, HA, fevers, chills, or SOB.       Objective: Blood pressure 106/68, pulse 78, temperature 98.6 F (37 C), temperature source Oral, resp. rate 18, height 5\' 3"  (1.6 m), weight 102.059 kg (225 lb), SpO2 99.00%, unknown if currently breastfeeding.  Physical Exam:  General: alert, cooperative and no distress Lochia: appropriate Uterine Fundus: firm Incision: N/A DVT Evaluation: No evidence of DVT seen on physical exam. Negative Homan's sign. No significant calf/ankle edema.    Recent Labs  01/20/14 1615 01/21/14 1348  HGB 11.5* 11.2*  HCT 33.9* 33.2*    Assessment/Plan: Jade Clark is a 23 y.o. G2X5284G2P1102 PPD#1 s/p NSVD at 6019w0d. She is doing well postpartum with only c/o mild vaginal irritation that is well-controlled with medication. She is afebrile and asymptomatic.   Plan for discharge tomorrow. Bottle feeding. Plan for OP circumcision and Nexplanon.      LOS: 2 days   Stephania FragminMilan, Bennie-John, T 01/22/2014, 7:42 AM

## 2014-01-23 NOTE — Discharge Instructions (Signed)

## 2014-01-23 NOTE — Discharge Summary (Signed)
Attestation of Attending Supervision of Obstetric Fellow: Evaluation and management procedures were performed by the Obstetric Fellow under my supervision and collaboration.  I have reviewed the Obstetric Fellow's note and chart, and I agree with the management and plan.  Mearle Drew, MD, FACOG Attending Obstetrician & Gynecologist Faculty Practice, [facility]   

## 2014-01-23 NOTE — Discharge Summary (Signed)
Obstetric Discharge Summary Reason for Admission: induction of labor Prenatal Procedures: none Intrapartum Procedures: spontaneous vaginal delivery Postpartum Procedures: none Complications-Operative and Postpartum: none Hemoglobin  Date Value Ref Range Status  01/21/2014 11.2* 12.0 - 15.0 g/dL Final     HCT  Date Value Ref Range Status  01/21/2014 33.2* 36.0 - 46.0 % Final    Physical Exam:  General: alert, cooperative, appears stated age and no distress Lochia: appropriate Uterine Fundus: firm DVT Evaluation: No evidence of DVT seen on physical exam. Negative Homan's sign. No cords or calf tenderness. No significant calf/ankle edema.  Discharge Diagnoses: Term Pregnancy-delivered  Discharge Information: Date: 01/23/2014 Activity: pelvic rest Diet: routine Medications: PNV, Ibuprofen and Miralax Condition: stable Instructions: refer to practice specific booklet Discharge to: home Follow-up Information   [facility] Schedule an appointment as soon as possible for a visit in 2 weeks. (Call and make appointment for 2 weeks to discuss birth control and followup to delivery.)    Specialty:  Obstetrics and Gynecology   Contact information:   [address] [place] [id number] [phone]      Hospital Course Jade Clark presented for induction of labor due to IUGR. She progressed and delivered via NSVD. She had an uncomplicated hospital course and is stable at discharge. She desires nexplanon for contraception and is bottle feeding.  Newborn Data: Live born female  Birth Weight: 5 lb 7.7 oz (2486 g) APGAR: 8, 9  Home with mother.  Quincy SimmondsFeeney, Patricia L 01/23/2014, 9:39 AM  I have seen and examined this patient and agree with above documentation in the resident's note.   Rulon AbideKeli Brevyn Ring, M.D. [facility] 01/23/2014 11:46 AM

## 2014-01-29 NOTE — Progress Notes (Signed)
I have seen and examined this patient and I agree with the above. Jade Clark 2:30 AM 01/29/2014

## 2014-02-09 ENCOUNTER — Ambulatory Visit: Payer: Medicaid Other | Admitting: Family Medicine

## 2014-03-05 ENCOUNTER — Ambulatory Visit (INDEPENDENT_AMBULATORY_CARE_PROVIDER_SITE_OTHER): Payer: Medicaid Other | Admitting: Nurse Practitioner

## 2014-03-05 ENCOUNTER — Encounter: Payer: Self-pay | Admitting: Nurse Practitioner

## 2014-03-05 DIAGNOSIS — Z30430 Encounter for insertion of intrauterine contraceptive device: Secondary | ICD-10-CM

## 2014-03-05 DIAGNOSIS — Z309 Encounter for contraceptive management, unspecified: Secondary | ICD-10-CM

## 2014-03-05 LAB — POCT PREGNANCY, URINE: PREG TEST UR: NEGATIVE

## 2014-03-05 MED ORDER — LEVONORGESTREL 20 MCG/24HR IU IUD
INTRAUTERINE_SYSTEM | Freq: Once | INTRAUTERINE | Status: AC
  Administered 2014-03-05: 13:00:00 via INTRAUTERINE

## 2014-03-05 NOTE — Progress Notes (Signed)
Patient ID: Jade Clark, female   DOB: 10/29/1991, 23 y.o.   MRN: [id number] Subjective:     Jade Clark is a 23 y.o. female who presents for a postpartum visit. She is 6 week postpartum following a spontaneous vaginal delivery. I have fully reviewed the prenatal and intrapartum course. The delivery was at 39 gestational weeks. Outcome: spontaneous vaginal delivery. Anesthesia: none. Postpartum course has been uneventful. Baby's course has been uneventful. Baby is feeding by bottle - Enfamil with Iron. Bleeding no bleeding. Bowel function is normal. Bladder function is normal. Patient is sexually active. Contraception method is condoms and IUD. Postpartum depression screening: negative.  The following portions of the patient's history were reviewed and updated as appropriate: current medications, past family history, past medical history, past social history, past surgical history and problem list.  Review of Systems Pertinent items are noted in HPI.   Objective:    BP 112/76  Pulse 85  Ht 5\' 2"  (1.575 m)  Wt 214 lb 12.8 oz (97.433 kg)  BMI 39.28 kg/m2  Breastfeeding? No  General:  alert   Breasts:  negative  Lungs: clear to auscultation bilaterally  Heart:  regular rate and rhythm, S1, S2 normal, no murmur, click, rub or gallop  Abdomen: soft, non-tender; bowel sounds normal; no masses,  no organomegaly   Vulva:  normal  Vagina: normal vagina  Cervix:  retroverted  Corpus: normal size, contour, position, consistency, mobility, non-tender  Adnexa:  normal adnexa  Rectal Exam: Normal rectovaginal exam        Assessment:   Results for orders placed in visit on 03/05/14 (from the past 24 hour(s))  POCT PREGNANCY, URINE     Status: None   Collection Time    03/05/14  1:09 PM      Result Value Ref Range   Preg Test, Ur NEGATIVE  NEGATIVE      postpartum exam. Pap smear not done at today's visit.   Plan:    1. Contraception: IUD 2.  3. Follow up in: 6 week or as needed.

## 2014-03-05 NOTE — Progress Notes (Signed)
Patient ID: Jade Clark, female   DOB: 07/08/1991, 23 y.o.   MRN: [id number] IUD Procedure Note Patient identified, informed consent performed, signed copy in chart, time out was performed.  Urine pregnancy test negative.  Speculum placed in the vagina.  Cervix visualized.  Cleaned with Betadine x 2.  Grasped anteriorly with a single tooth tenaculum.  Uterus sounded to 6 cm.  Mirena IUD placed per manufacturer's recommendations.  Strings trimmed to 3 cm. Tenaculum was removed, good hemostasis noted.  Patient tolerated procedure well.   Patient given post procedure instructions  Patient is asked to check IUD strings periodically and follow up in 4-6 weeks for IUD check.

## 2014-03-05 NOTE — Patient Instructions (Signed)
Levonorgestrel intrauterine device (IUD) What is this medicine? LEVONORGESTREL IUD (LEE voe nor jes trel) is a contraceptive (birth control) device. The device is placed inside the uterus by a healthcare professional. It is used to prevent pregnancy and can also be used to treat heavy bleeding that occurs during your period. Depending on the device, it can be used for 3 to 5 years. This medicine may be used for other purposes; ask your health care provider or pharmacist if you have questions. COMMON BRAND NAME(S): Mirena, Skyla What should I tell my health care provider before I take this medicine? They need to know if you have any of these conditions: -abnormal Pap smear -cancer of the breast, uterus, or cervix -diabetes -endometritis -genital or pelvic infection now or in the past -have more than one sexual partner or your partner has more than one partner -heart disease -history of an ectopic or tubal pregnancy -immune system problems -IUD in place -liver disease or tumor -problems with blood clots or take blood-thinners -use intravenous drugs -uterus of unusual shape -vaginal bleeding that has not been explained -an unusual or allergic reaction to levonorgestrel, other hormones, silicone, or polyethylene, medicines, foods, dyes, or preservatives -pregnant or trying to get pregnant -breast-feeding How should I use this medicine? This device is placed inside the uterus by a health care professional. Talk to your pediatrician regarding the use of this medicine in children. Special care may be needed. Overdosage: If you think you have taken too much of this medicine contact a poison control center or emergency room at once. NOTE: This medicine is only for you. Do not share this medicine with others. What if I miss a dose? This does not apply. What may interact with this medicine? Do not take this medicine with any of the following  medications: -amprenavir -bosentan -fosamprenavir This medicine may also interact with the following medications: -aprepitant -barbiturate medicines for inducing sleep or treating seizures -bexarotene -griseofulvin -medicines to treat seizures like carbamazepine, ethotoin, felbamate, oxcarbazepine, phenytoin, topiramate -modafinil -pioglitazone -rifabutin -rifampin -rifapentine -some medicines to treat HIV infection like atazanavir, indinavir, lopinavir, nelfinavir, tipranavir, ritonavir -St. John's wort -warfarin This list may not describe all possible interactions. Give your health care provider a list of all the medicines, herbs, non-prescription drugs, or dietary supplements you use. Also tell them if you smoke, drink alcohol, or use illegal drugs. Some items may interact with your medicine. What should I watch for while using this medicine? Visit your doctor or health care professional for regular check ups. See your doctor if you or your partner has sexual contact with others, becomes HIV positive, or gets a sexual transmitted disease. This product does not protect you against HIV infection (AIDS) or other sexually transmitted diseases. You can check the placement of the IUD yourself by reaching up to the top of your vagina with clean fingers to feel the threads. Do not pull on the threads. It is a good habit to check placement after each menstrual period. Call your doctor right away if you feel more of the IUD than just the threads or if you cannot feel the threads at all. The IUD may come out by itself. You may become pregnant if the device comes out. If you notice that the IUD has come out use a backup birth control method like condoms and call your health care provider. Using tampons will not change the position of the IUD and are okay to use during your period. What side effects may I   notice from receiving this medicine? Side effects that you should report to your doctor or  health care professional as soon as possible: -allergic reactions like skin rash, itching or hives, swelling of the face, lips, or tongue -fever, flu-like symptoms -genital sores -high blood pressure -no menstrual period for 6 weeks during use -pain, swelling, warmth in the leg -pelvic pain or tenderness -severe or sudden headache -signs of pregnancy -stomach cramping -sudden shortness of breath -trouble with balance, talking, or walking -unusual vaginal bleeding, discharge -yellowing of the eyes or skin Side effects that usually do not require medical attention (report to your doctor or health care professional if they continue or are bothersome): -acne -breast pain -change in sex drive or performance -changes in weight -cramping, dizziness, or faintness while the device is being inserted -headache -irregular menstrual bleeding within first 3 to 6 months of use -nausea This list may not describe all possible side effects. Call your doctor for medical advice about side effects. You may report side effects to FDA at 1-800-FDA-1088. Where should I keep my medicine? This does not apply. NOTE: This sheet is a summary. It may not cover all possible information. If you have questions about this medicine, talk to your doctor, pharmacist, or health care provider.  2014, Elsevier/Gold Standard. (2011-12-28 13:54:04)  

## 2014-04-06 ENCOUNTER — Encounter: Payer: Self-pay | Admitting: *Deleted

## 2014-04-17 ENCOUNTER — Ambulatory Visit (INDEPENDENT_AMBULATORY_CARE_PROVIDER_SITE_OTHER): Payer: Medicaid Other | Admitting: Medical

## 2014-04-17 ENCOUNTER — Encounter: Payer: Self-pay | Admitting: Medical

## 2014-04-17 VITALS — BP 117/77 | HR 104 | Temp 97.9°F | Ht 62.0 in | Wt 214.9 lb

## 2014-04-17 DIAGNOSIS — L0232 Furuncle of buttock: Secondary | ICD-10-CM

## 2014-04-17 DIAGNOSIS — Z309 Encounter for contraceptive management, unspecified: Secondary | ICD-10-CM

## 2014-04-17 DIAGNOSIS — L0233 Carbuncle of buttock: Secondary | ICD-10-CM

## 2014-04-17 NOTE — Progress Notes (Signed)
Patient ID: Jade Clark Hilaire, female   DOB: 10/26/1991, 23 y.o.   MRN: [id number]  History:  Jade Clark is a 23 y.o. G2P1011 who presents to clinic today for IUD string check. She had the IUD placed in [facility]OC at her PP visit on 03/05/14. The patient states that she has had bleeding off and on since IUD insertion that is starting to lighten up. She denies pain or irritation with intercourse.   The following portions of the patient's history were reviewed and updated as appropriate: allergies, current medications, past family history, past medical history, past social history, past surgical history and problem list.  Review of Systems:  Pertinent items are noted in HPI.  Objective:  Physical Exam BP 117/77  Pulse 104  Temp(Src) 97.9 F (36.6 C)  Ht 5\' 2"  (1.575 m)  Wt 214 lb 14.4 oz (97.478 kg)  BMI 39.30 kg/m2  LMP 04/10/2014 GENERAL: Well-developed, well-nourished female in no acute distress.  HEENT: Normocephalic LUNGS: Normal effort HEART: Regular rate  ABDOMEN: Soft, nontender, nondistended.   PELVIC: Normal external female genitalia. Vagina is pink and rugated.  Normal discharge. Normal cervix contour. Scant blood in the vagina. IUD string are visualized and appropriate length  EXTREMITIES: No cyanosis, clubbing, or edema   Assessment & Plan:  Assessment: IUD check up  Plans: Patient counseled on bleeding profile with IUD. Will continue to monitor and return if bleeding worsens Patient referred to [facility] to establish primary care and for recurrent boils Patient will be due for annual exam with pap smear 02/2015. She is aware and plans to return. She will call early next year for an appointment.   Freddi StarrJulie N Ethier, PA-C 04/17/2014 10:47 AM

## 2014-04-17 NOTE — Patient Instructions (Signed)
Levonorgestrel intrauterine device (IUD) What is this medicine? LEVONORGESTREL IUD (LEE voe nor jes trel) is a contraceptive (birth control) device. The device is placed inside the uterus by a healthcare professional. It is used to prevent pregnancy and can also be used to treat heavy bleeding that occurs during your period. Depending on the device, it can be used for 3 to 5 years. This medicine may be used for other purposes; ask your health care provider or pharmacist if you have questions. COMMON BRAND NAME(S): Mirena, Skyla What should I tell my health care provider before I take this medicine? They need to know if you have any of these conditions: -abnormal Pap smear -cancer of the breast, uterus, or cervix -diabetes -endometritis -genital or pelvic infection now or in the past -have more than one sexual partner or your partner has more than one partner -heart disease -history of an ectopic or tubal pregnancy -immune system problems -IUD in place -liver disease or tumor -problems with blood clots or take blood-thinners -use intravenous drugs -uterus of unusual shape -vaginal bleeding that has not been explained -an unusual or allergic reaction to levonorgestrel, other hormones, silicone, or polyethylene, medicines, foods, dyes, or preservatives -pregnant or trying to get pregnant -breast-feeding How should I use this medicine? This device is placed inside the uterus by a health care professional. Talk to your pediatrician regarding the use of this medicine in children. Special care may be needed. Overdosage: If you think you have taken too much of this medicine contact a poison control center or emergency room at once. NOTE: This medicine is only for you. Do not share this medicine with others. What if I miss a dose? This does not apply. What may interact with this medicine? Do not take this medicine with any of the following  medications: -amprenavir -bosentan -fosamprenavir This medicine may also interact with the following medications: -aprepitant -barbiturate medicines for inducing sleep or treating seizures -bexarotene -griseofulvin -medicines to treat seizures like carbamazepine, ethotoin, felbamate, oxcarbazepine, phenytoin, topiramate -modafinil -pioglitazone -rifabutin -rifampin -rifapentine -some medicines to treat HIV infection like atazanavir, indinavir, lopinavir, nelfinavir, tipranavir, ritonavir -St. John's wort -warfarin This list may not describe all possible interactions. Give your health care provider a list of all the medicines, herbs, non-prescription drugs, or dietary supplements you use. Also tell them if you smoke, drink alcohol, or use illegal drugs. Some items may interact with your medicine. What should I watch for while using this medicine? Visit your doctor or health care professional for regular check ups. See your doctor if you or your partner has sexual contact with others, becomes HIV positive, or gets a sexual transmitted disease. This product does not protect you against HIV infection (AIDS) or other sexually transmitted diseases. You can check the placement of the IUD yourself by reaching up to the top of your vagina with clean fingers to feel the threads. Do not pull on the threads. It is a good habit to check placement after each menstrual period. Call your doctor right away if you feel more of the IUD than just the threads or if you cannot feel the threads at all. The IUD may come out by itself. You may become pregnant if the device comes out. If you notice that the IUD has come out use a backup birth control method like condoms and call your health care provider. Using tampons will not change the position of the IUD and are okay to use during your period. What side effects may I   notice from receiving this medicine? Side effects that you should report to your doctor or  health care professional as soon as possible: -allergic reactions like skin rash, itching or hives, swelling of the face, lips, or tongue -fever, flu-like symptoms -genital sores -high blood pressure -no menstrual period for 6 weeks during use -pain, swelling, warmth in the leg -pelvic pain or tenderness -severe or sudden headache -signs of pregnancy -stomach cramping -sudden shortness of breath -trouble with balance, talking, or walking -unusual vaginal bleeding, discharge -yellowing of the eyes or skin Side effects that usually do not require medical attention (report to your doctor or health care professional if they continue or are bothersome): -acne -breast pain -change in sex drive or performance -changes in weight -cramping, dizziness, or faintness while the device is being inserted -headache -irregular menstrual bleeding within first 3 to 6 months of use -nausea This list may not describe all possible side effects. Call your doctor for medical advice about side effects. You may report side effects to FDA at 1-800-FDA-1088. Where should I keep my medicine? This does not apply. NOTE: This sheet is a summary. It may not cover all possible information. If you have questions about this medicine, talk to your doctor, pharmacist, or health care provider.  2014, Elsevier/Gold Standard. (2011-12-28 13:54:04)  

## 2014-05-12 ENCOUNTER — Ambulatory Visit (INDEPENDENT_AMBULATORY_CARE_PROVIDER_SITE_OTHER): Payer: No Typology Code available for payment source | Admitting: Family Medicine

## 2014-05-12 ENCOUNTER — Encounter: Payer: Self-pay | Admitting: Family Medicine

## 2014-05-12 VITALS — BP 120/76 | HR 78 | Temp 99.4°F | Ht 62.0 in | Wt 214.0 lb

## 2014-05-12 DIAGNOSIS — J4520 Mild intermittent asthma, uncomplicated: Secondary | ICD-10-CM | POA: Insufficient documentation

## 2014-05-12 DIAGNOSIS — J45909 Unspecified asthma, uncomplicated: Secondary | ICD-10-CM

## 2014-05-12 DIAGNOSIS — L732 Hidradenitis suppurativa: Secondary | ICD-10-CM | POA: Insufficient documentation

## 2014-05-12 MED ORDER — CLINDAMYCIN PHOSPHATE 1 % EX SOLN: Freq: Two times a day (BID) | CUTANEOUS | Status: AC

## 2014-05-12 NOTE — Progress Notes (Signed)
Patient ID: Tyeler Dines, female   DOB: 04-04-1991, 23 y.o.   MRN: [id number]   [facility] Charlane Ferretti, MD Phone: [phone]  Subjective:  Ms Lograsso is ia 23 y.o F who is here to establish care (is not due for yearly physical until later in the year)  # Skin "abscesses" -has had since as long as she can remember -occasionally has some in her armpit but largely in groin and buttock regions -lesions are red raised painful; occasionally will drain and pt will attempt to squeeze lesions -has noted that they are worse after shaving but are constantly present no matter what -has a sister who also has similar lesions -mother told her to take a laxative to cure them, otherwise she has not tried any treatment   All systems were reviewed and were negative unless otherwise noted in the HPI  Past Medical History Patient Active Problem List   Diagnosis Date Noted  . Asthma, intermittent 05/12/2014  . Contraception management 03/05/2014   Reviewed problem list.  Medications- reviewed and updated Chief complaint-noted No additions to family history Social history- patient is an occasional smoker  Objective: BP 120/76  Pulse 78  Temp(Src) 99.4 F (37.4 C) (Oral)  Ht 5\' 2"  (1.575 m)  Wt 214 lb (97.07 kg)  BMI 39.13 kg/m2  LMP 04/10/2014 Gen: NAD, alert, cooperative with exam Neck: FROM, supple Neuro: Alert and oriented, No gross deficits Skin: papules, pustules, and a few inflammatory nodules noted in bilateral inguinal area and near perineum; one small lesion with follicle entrapment   Assessment/Plan: See problem based a/p

## 2014-05-12 NOTE — Assessment & Plan Note (Addendum)
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  -prescribed topical clinda BID daily -pt to f/up in clinic prn

## 2014-05-12 NOTE — Patient Instructions (Signed)
[name] it was great to see you today!  Please start topical clinda twice a day; do not apply internally Please call to schedule a yearly physical at your convenience  Looking forward to seeing you soon Jade Ferretti, MD  Patient information: Hidradenitis suppurativa (The Basics)  Written by the doctors and editors at UpToDate What is hidradenitis suppurativa? - Hidradenitis suppurativa is a condition that causes red, swollen, painful bumps to form on the body, usually in places where the skin rubs together. These bumps can cause so much pain that they make it hard to move. They can smell bad or drain pus or blood. The bumps also tend to linger for weeks or months and keep coming back.  People who have hidradenitis suppurativa, also called "HS," often have a hard time dealing with their problem. It can make them feel embarrassed and worried. Sometimes the condition can even cause problems in relationships, or in the workplace. If you have this problem, see a doctor or nurse. There are treatments that can help you.  What are the symptoms of HS? - The main symptoms are red, swollen, painful bumps. These bumps can drain pus or blood.  The bumps usually form in places where the skin rubs together. Common locations include:  ?Armpits ?On or under the breasts (in women) ?In the groin area ?Inner thighs ?Buttocks ?Around or near the anus The skin problems caused by HS last a long time and get worse over time. Often the skin hardens and scars around the painful bumps. Plus, many bumps can form in a single area and sometimes form tunnels under the skin.  Should I see a doctor or nurse? - Yes. If you have symptoms of HS, see a doctor or nurse. He or she can look at your skin and find out if HS is the cause of your symptoms. Make sure you tell the doctor or nurse if you feel sad or embarrassed because of your symptoms. The doctor or nurse can help you deal with these problems.  How is HS treated?  - Treatment can include:  ?Antibiotic liquids or gels that you put on the affected areas ?Antibiotic pills, which you might need to take for a long time ?Injections of steroid medicines into affected areas to bring down inflammation Women with HS sometimes also take hormone treatments to improve their condition. This usually involves taking a birth control pill every day. Sometimes women take other types of hormone medicines, too.  People with severe, long-lasting problems can have surgery that helps HS to heal. They can also get special medicines that can reduce inflammation in the skin.  Is there anything I can do on my own to feel better? - Yes. First, you should know that you did not do anything to cause your condition. It is not your fault. You did not cause it by being unclean. You should also know that you cannot spread your condition to anyone else. It is not "contagious."  Here are some things you can to reduce your symptoms:  ?Do not wear tight-fitting clothes. ?Try to avoid activities that cause your skin to rub against itself a lot. ?Shower every day and wash areas of HS gently with your fingers. Do not scrub affected areas with a washcloth, loofah, or brush. ?Stop smoking, if you smoke. People who smoke are more likely to have HS. ?Lose weight, if you are overweight. HS is more common and more severe in people who are overweight.

## 2014-10-12 ENCOUNTER — Encounter: Payer: Self-pay | Admitting: Family Medicine

## 2015-04-04 IMAGING — US US OB LIMITED
1 series · 13 of 21 positions shown · non-contrast
Comparison: none

[Series 1: us ob comp less 14 wks · 13 of 21 slices shown]
[im 1/21]
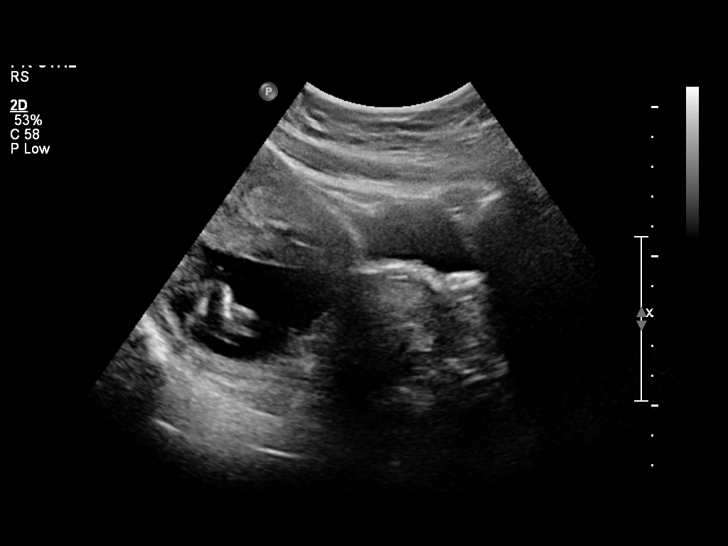
[im 3/21]
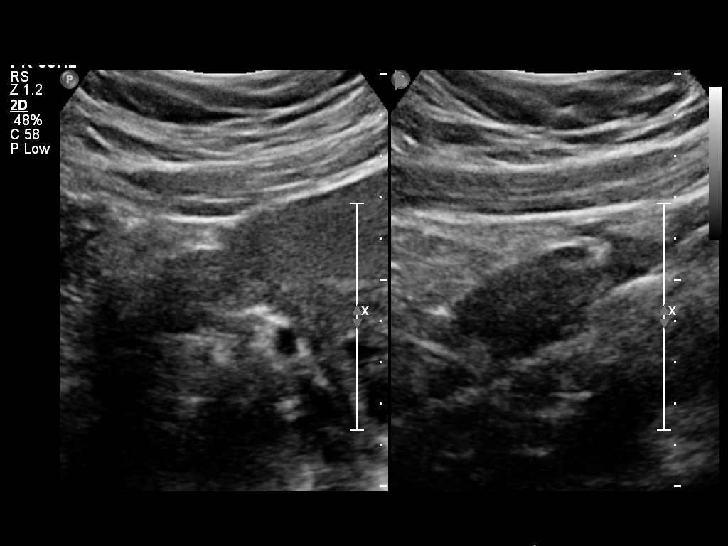
[im 5/21]
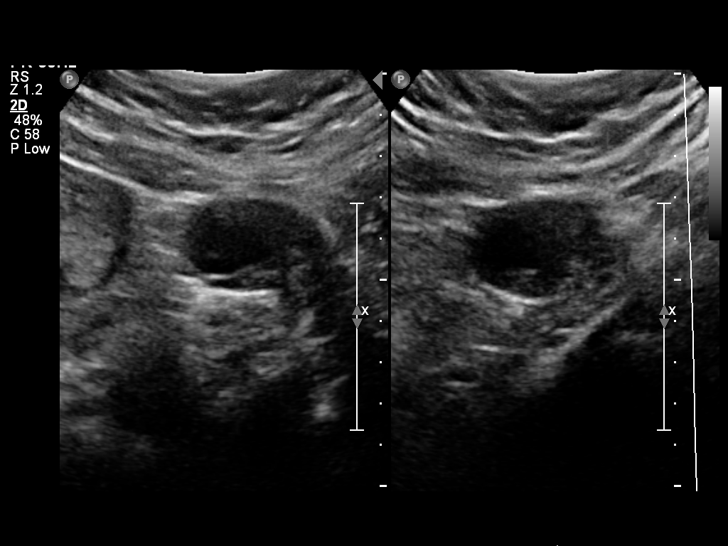
[im 6/21]
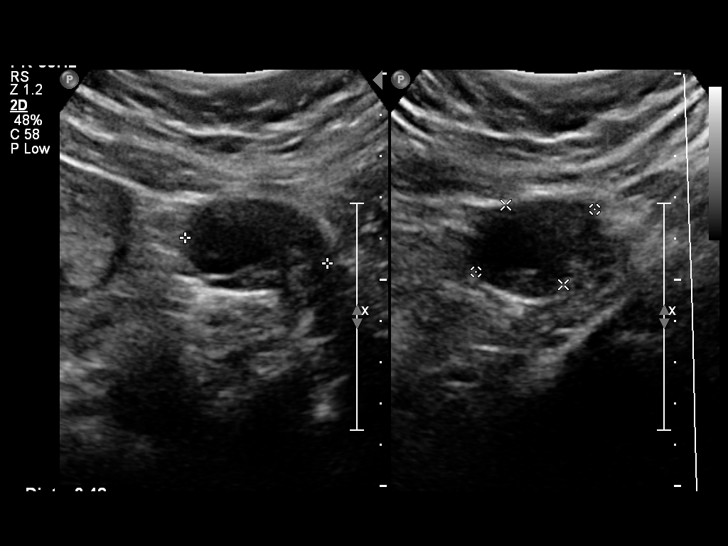
[im 8/21]
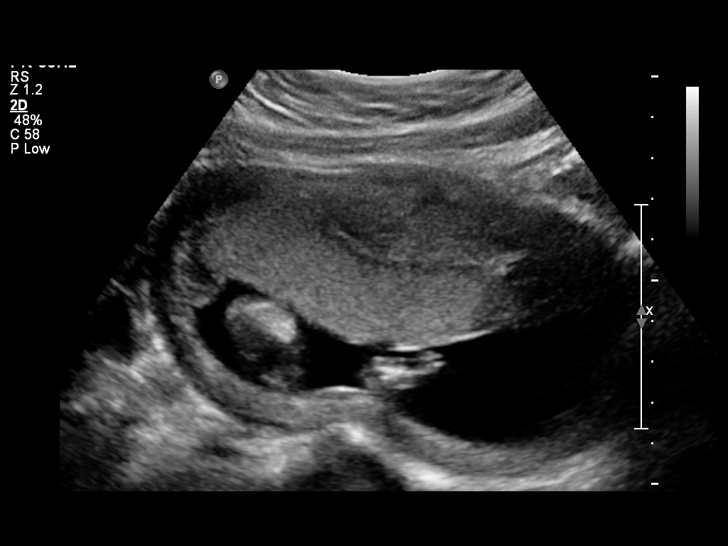
[im 9/21]
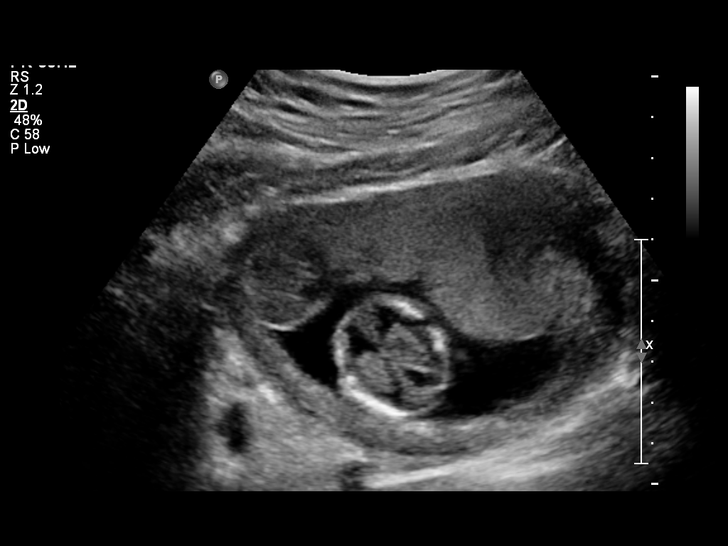
[im 11/21]
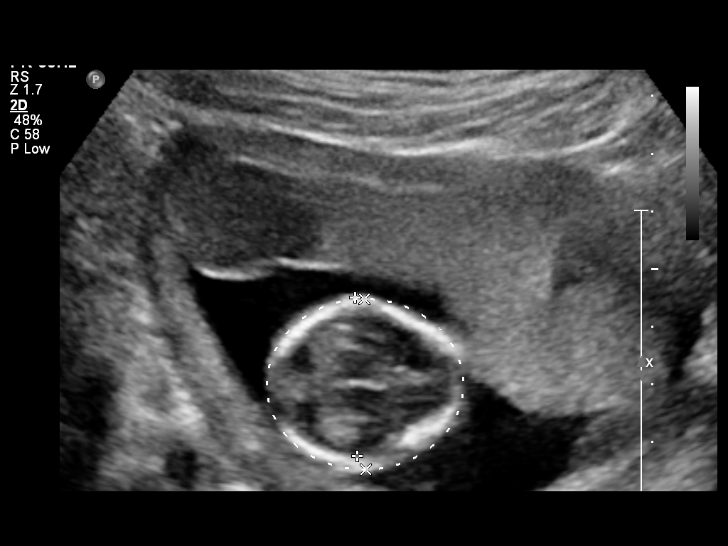
[im 13/21]
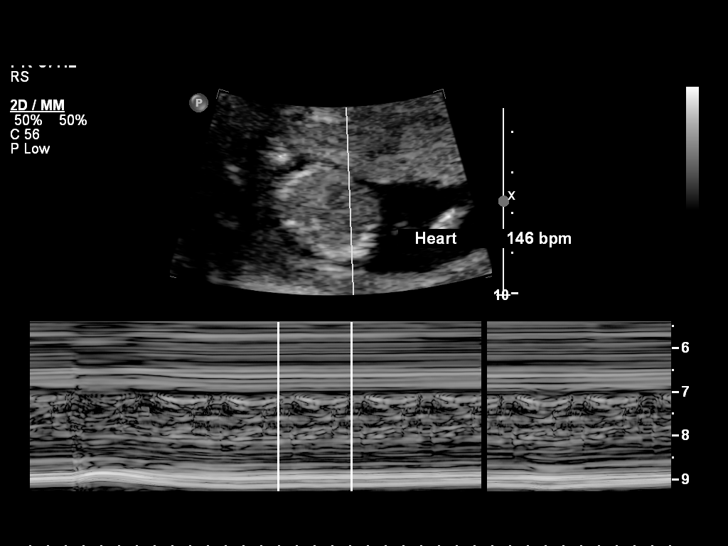
[im 14/21]
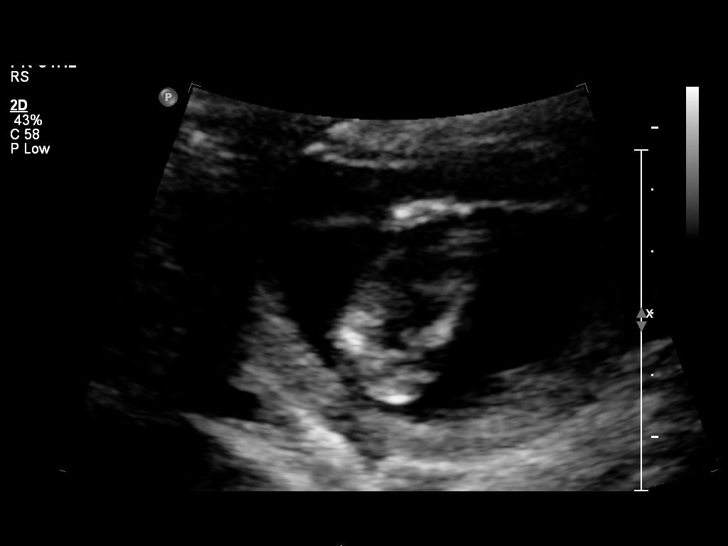
[im 16/21]
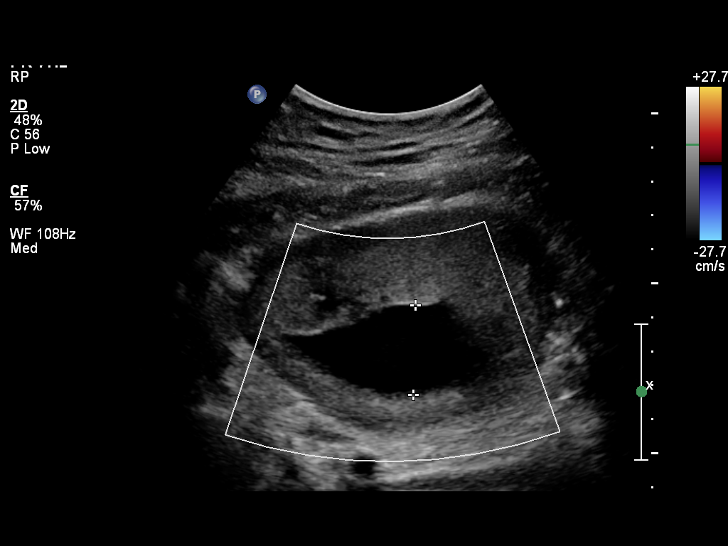
[im 17/21]
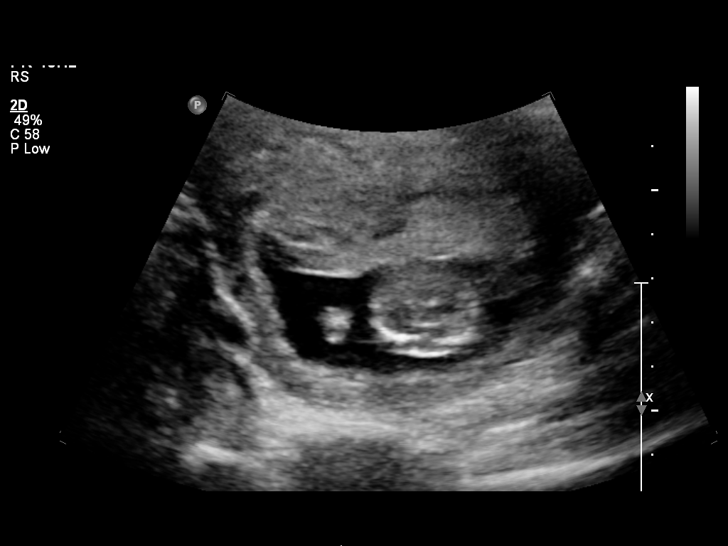
[im 19/21]
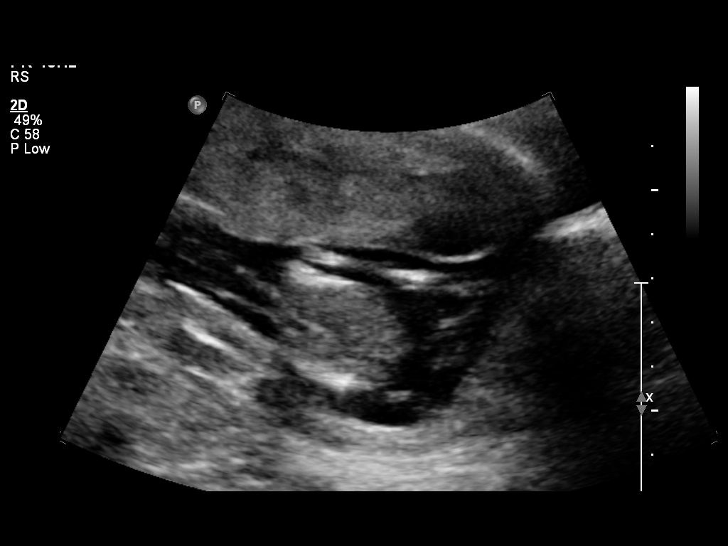
[im 21/21]
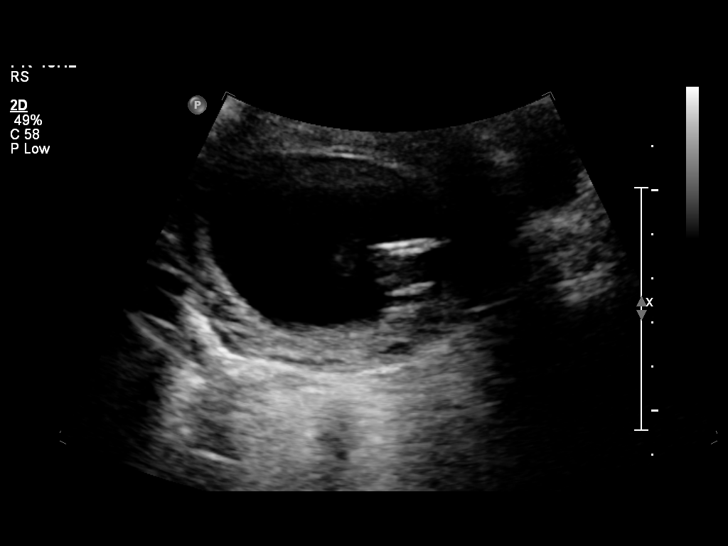

[13 of 21 positions shown; findings below may reference images not displayed]

OBSTETRICS REPORT
                      (Signed Final 08/05/2013 [DATE])

Service(s) Provided

 [HOSPITAL]                                         76815.0
Indications

 Pregnancy with inconclusive fetal viability
 Uncertain LMP;  Establish Gestational [AGE]
Fetal Evaluation

 Num Of Fetuses:    1
 Preg. Location:    Intrauterine
 Fetal Heart Rate:  146                         bpm
 Cardiac Activity:  Observed
 Presentation:      Breech
 Placenta:          Anterior, above cervical os
 P. Cord            Not well visualized
 Insertion:

 Amniotic Fluid
 AFI FV:      Subjectively within normal limits
                                             Larg Pckt:   2.62   cm
 RLQ:   2.62   cm
Biometry

 BPD:     27.5  mm    G. Age:   14w 6d                CI:
 HC:      99.9  mm    G. Age:   14w 5d
Cervix Uterus Adnexa

 Cervix:       Normal appearance by transabdominal scan.
 Uterus:       No abnormality visualized.
 Left Ovary:   No adnexal mass visualized.
 Right Ovary:  No adnexal mass visualized.
 Adnexa:     No abnormality visualized.
Impression

 Single live IUP in breech presentation.   No acute
 abnormality.
 Recommend full anatomic survey at 18-20 weeks gestation
 for comprehensive evaluation.
 questions or concerns.

## 2015-05-09 IMAGING — US US OB COMP +14 WK
1 series · 12 of 28 positions shown · non-contrast
Comparison: none

[Series 1: us ob comp +14 wk · 78 acquisitions, 12 frames shown]
[im 3/78]
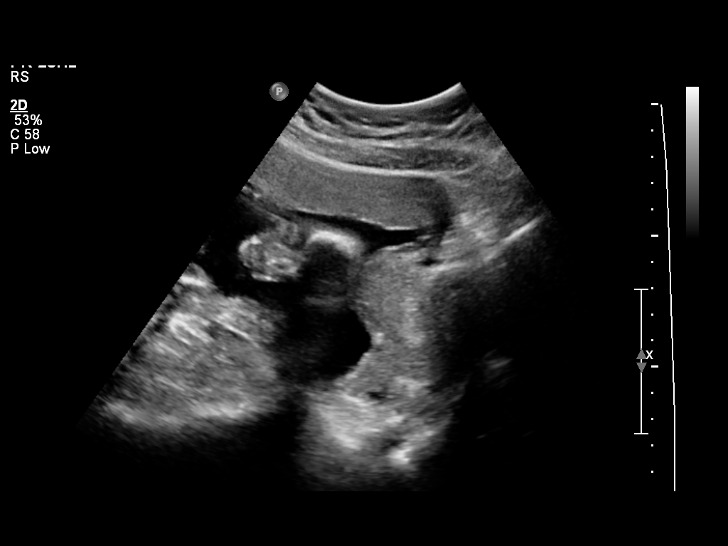
[im 9/78]
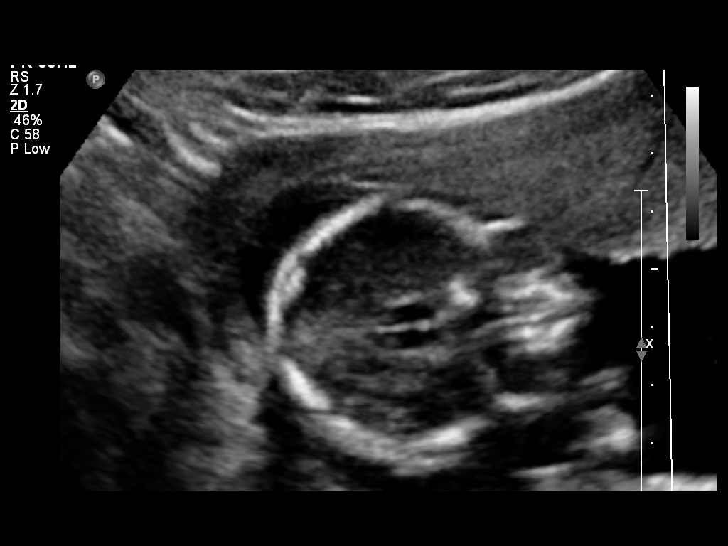
[im 15/78]
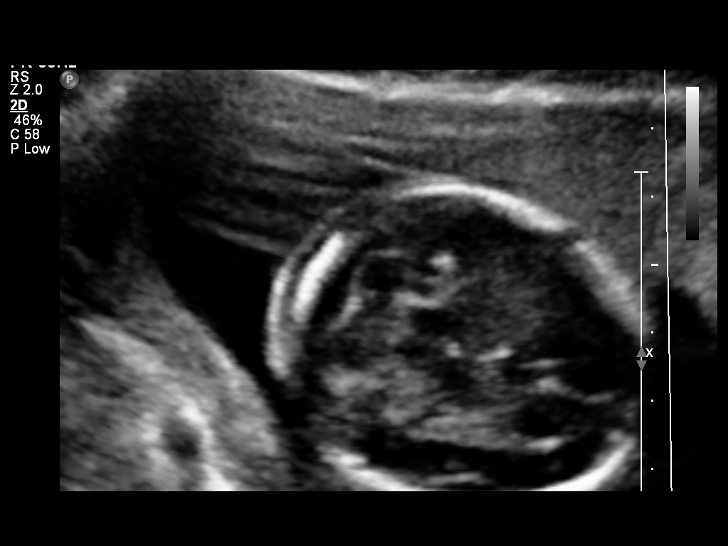
[im 23/78]
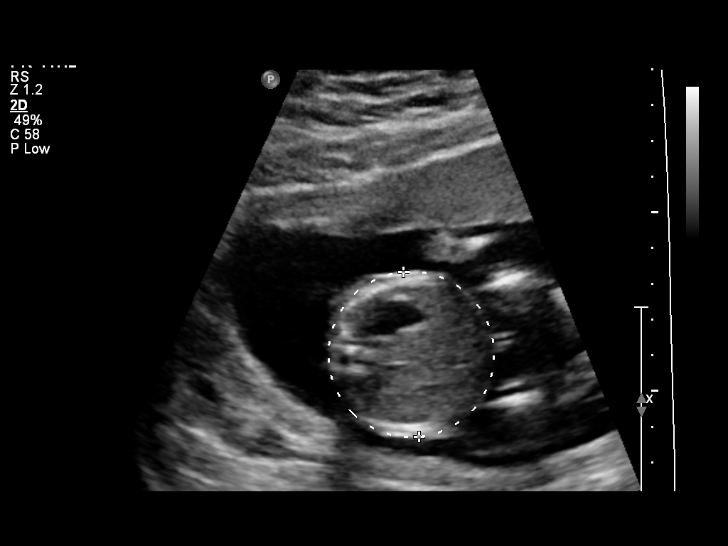
[im 29/78]
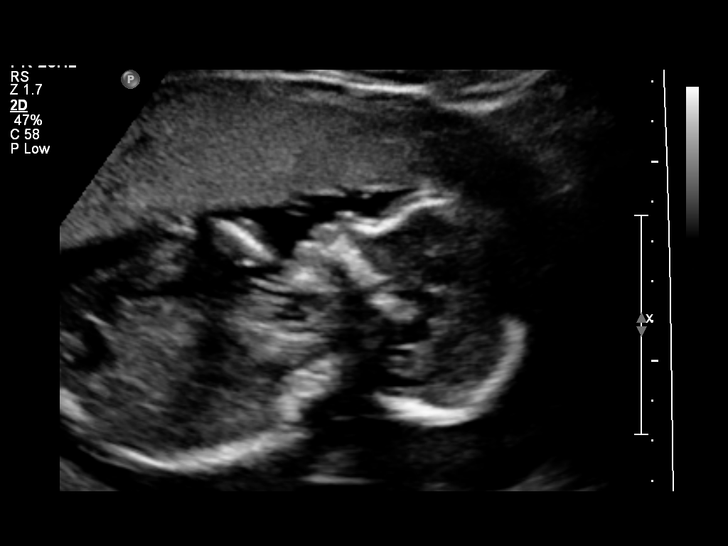
[im 35/78]
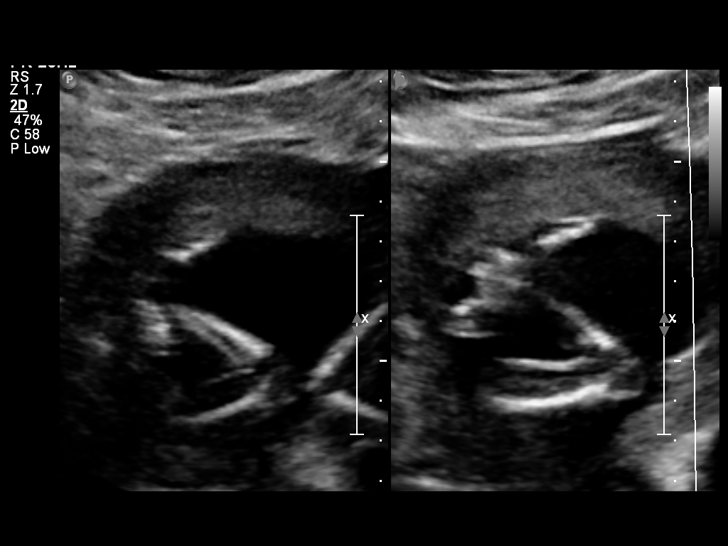
[im 43/78]
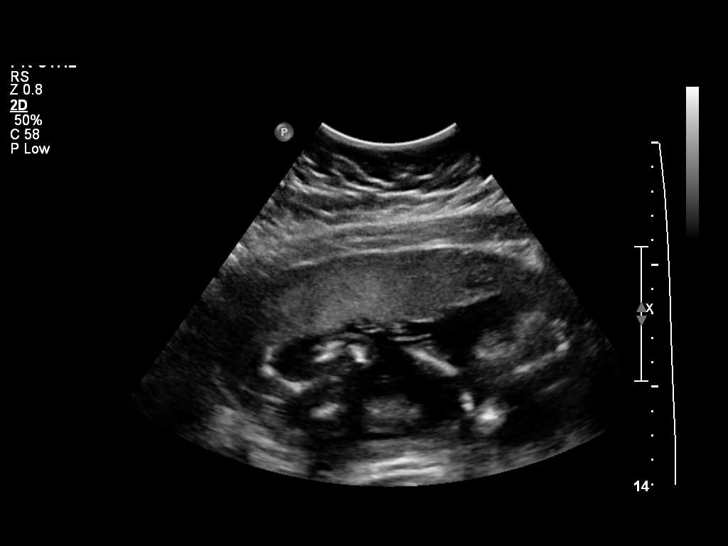
[im 49/78]
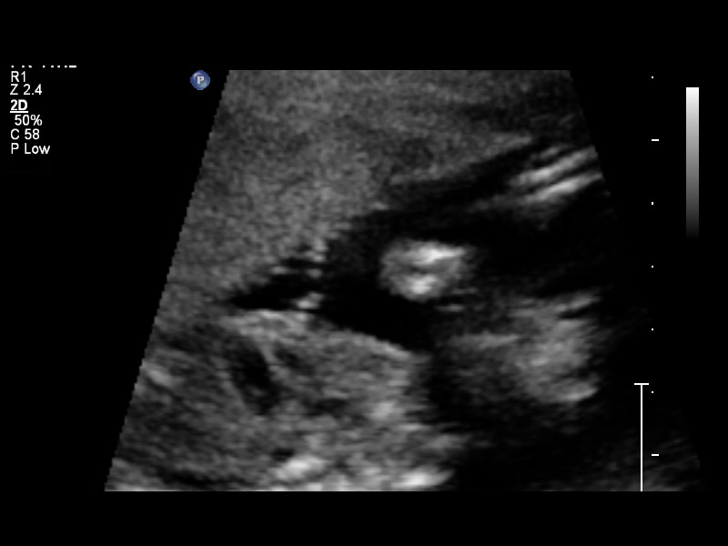
[im 55/78]
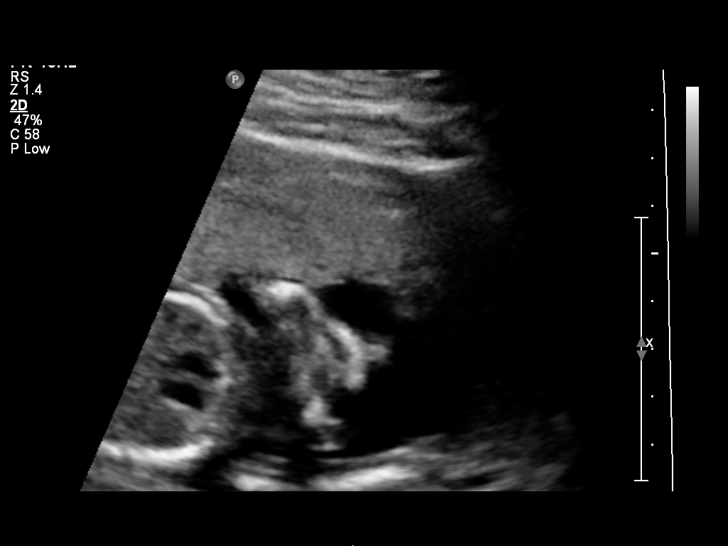
[im 63/78]
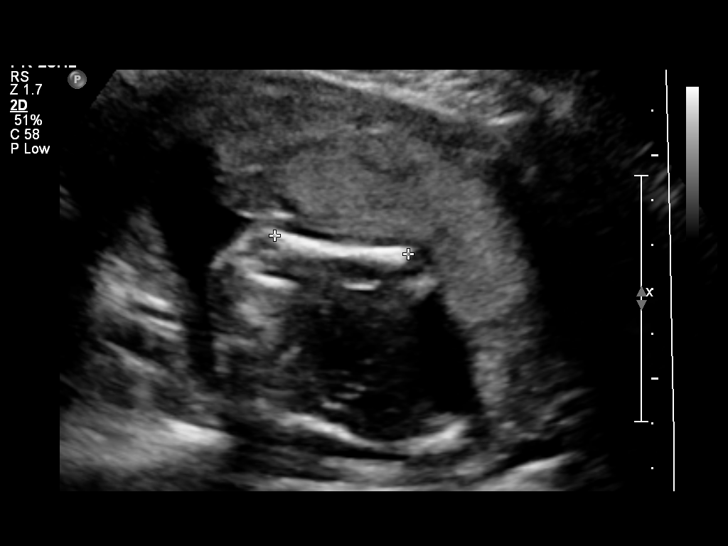
[im 69/78]
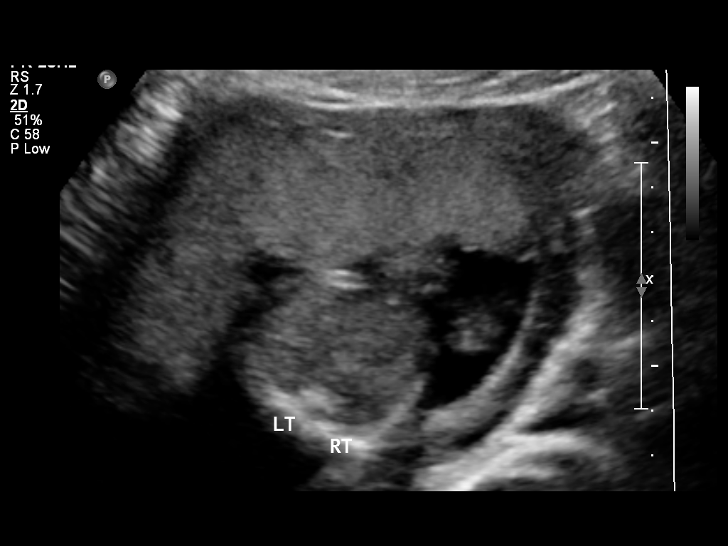
[im 75/78]
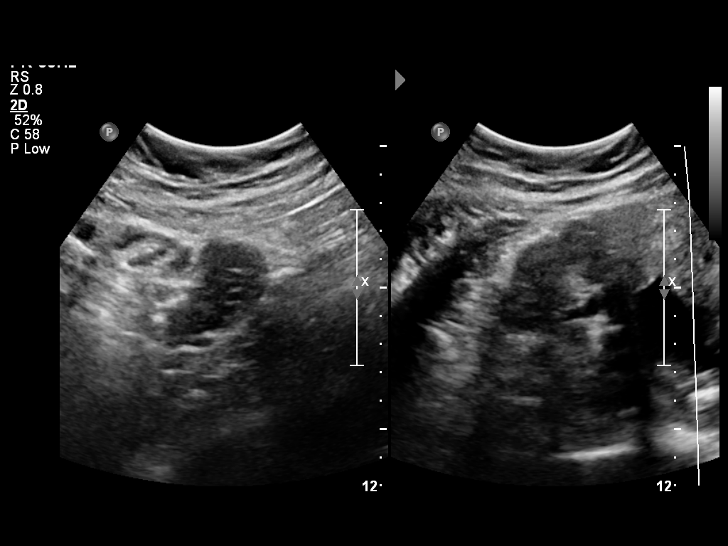

[12 of 28 positions shown; findings below may reference images not displayed]

OBSTETRICS REPORT
                      (Signed Final 09/09/2013 [DATE])

Service(s) Provided

 US OB COMP + 14 WK                                    76805.1
Indications

 Basic anatomic survey
Fetal Evaluation

 Num Of Fetuses:    1
 Preg. Location:    Intrauterine
 Fetal Heart Rate:  143                          bpm
 Cardiac Activity:  Observed
 Presentation:      Cephalic
 Placenta:          Anterior, above cervical os
 P. Cord            Visualized, central
 Insertion:

 Amniotic Fluid
 AFI FV:      Subjectively within normal limits
                                             Larg Pckt:     5.0  cm
Biometry

 BPD:     45.5  mm     G. Age:  19w 5d                CI:         77.6   70 - 86
 OFD:     58.6  mm                                    FL/HC:      18.7   16.8 -

 HC:     166.1  mm     G. Age:  19w 2d       20  %    HC/AC:      1.15   1.09 -

 AC:     144.2  mm     G. Age:  19w 5d       41  %    FL/BPD:
 FL:      31.1  mm     G. Age:  19w 4d       35  %    FL/AC:      21.6   20 - 24
 HUM:     30.4  mm     G. Age:  20w 0d       58  %
 CER:     19.9  mm     G. Age:  19w 0d       30  %
 NFT:     3.63  mm

 Est. FW:     306  gm    0 lb 11 oz      45  %
Gestational Age

 U/S Today:     19w 4d                                        EDD:   01/30/14
 Best:          19w 6d     Det. By:  Early Ultrasound         EDD:   01/28/14
Anatomy
 Cranium:          Appears normal         Aortic Arch:      Not well visualized
 Fetal Cavum:      Appears normal         Ductal Arch:      Not well visualized
 Ventricles:       Appears normal         Diaphragm:        Appears normal
 Choroid Plexus:   Appears normal         Stomach:          Appears normal
 Cerebellum:       Appears normal         Abdomen:          Appears normal
 Posterior Fossa:  Appears normal         Abdominal Wall:   Appears nml (cord
                                                            insert, abd wall)
 Nuchal Fold:      Appears normal         Cord Vessels:     Appears normal (3
                                                            vessel cord)
 Face:             Appears normal         Kidneys:          Appear normal
                   (orbits and profile)
 Lips:             Appears normal         Bladder:          Appears normal
 Heart:            Appears normal         Spine:            Limited but no
                   (4CH, axis, and                          intracranial signs of
                   situs)                                   NTD
 RVOT:             Appears normal         Lower             Appears normal
                                          Extremities:
 LVOT:             Appears normal         Upper             Appears normal
                                          Extremities:

 Other:  Fetus appears to be a male. 5th digit visualized. Technically difficult
         due to maternal habitus and fetal position.
Targeted Anatomy

 Fetal Central Nervous System
 Lat. Ventricles:  6.2                    Cisterna Magna:
Cervix Uterus Adnexa

 Cervical Length:    3.16     cm

 Cervix:       Normal appearance by transabdominal scan.
 Uterus:       No abnormality visualized.
 Cul De Sac:   No free fluid seen.

 Left Ovary:    Size(cm) L: 3.62 x W: 3.19 x H: 2.26  Volume(cc):

 Right Ovary:   Size(cm) L: 3.31 x W: 2.15 x H: 2.07  Volume(cc):
 Adnexa:     No adnexal mass visualized.
Impression

 Single IUP at 19 [DATE] weeks
 Normal fetal anatomic survey
 Somewhat limited views of the heart were obtained (arches)
 No markers associated with aneuploidy were noted
 Anterior placenta without previa
 Normal amniotic fluid volume
Recommendations

 Recommend follow-up ultrasound examination in 4 weeks to
 reevaluate heart anatomy

 questions or concerns.

## 2015-08-22 IMAGING — US US OB FOLLOW-UP
1 series · 12 of 28 positions shown · non-contrast
Comparison: none

[Series 1: us ob follow-up · 12 of 35 slices shown]
[im 2/35]
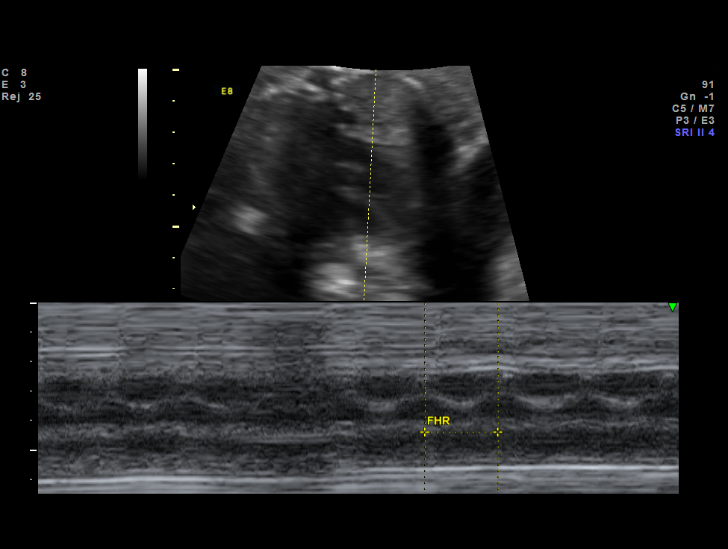
[im 4/35]
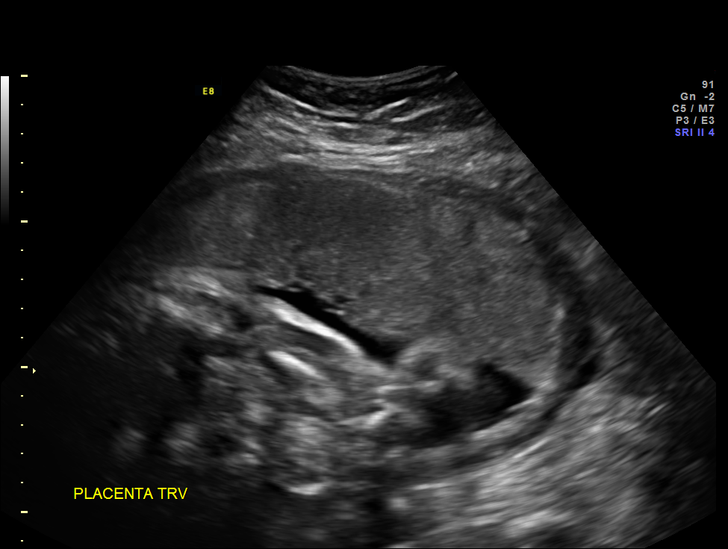
[im 7/35]
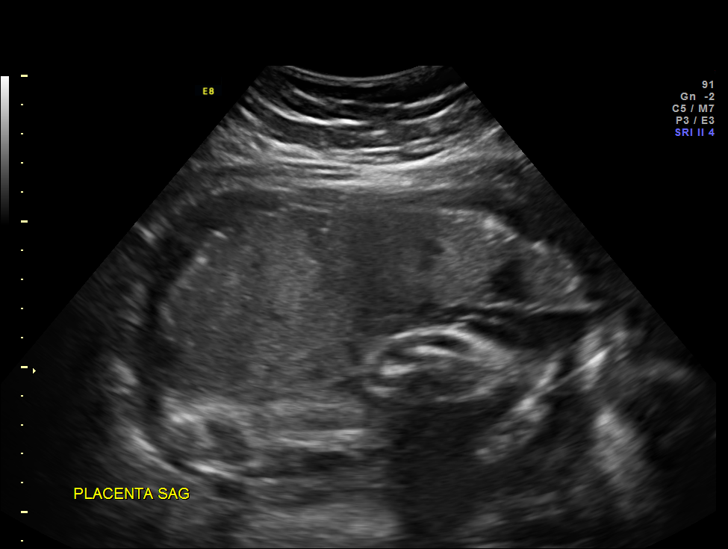
[im 11/35]
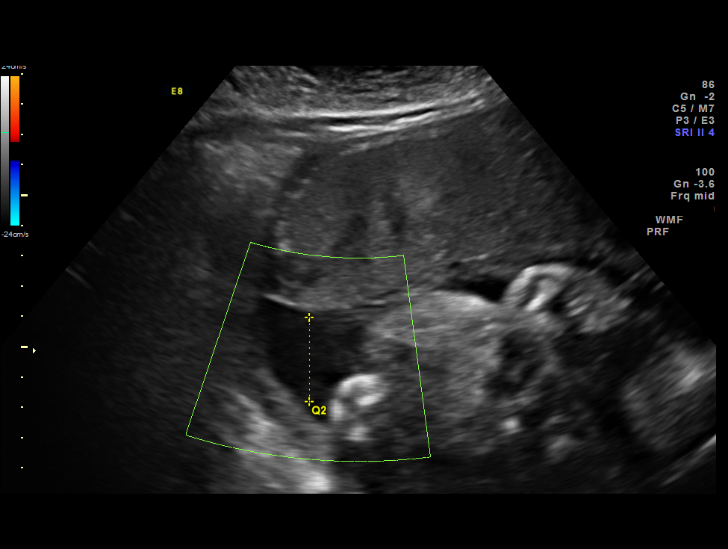
[im 13/35]
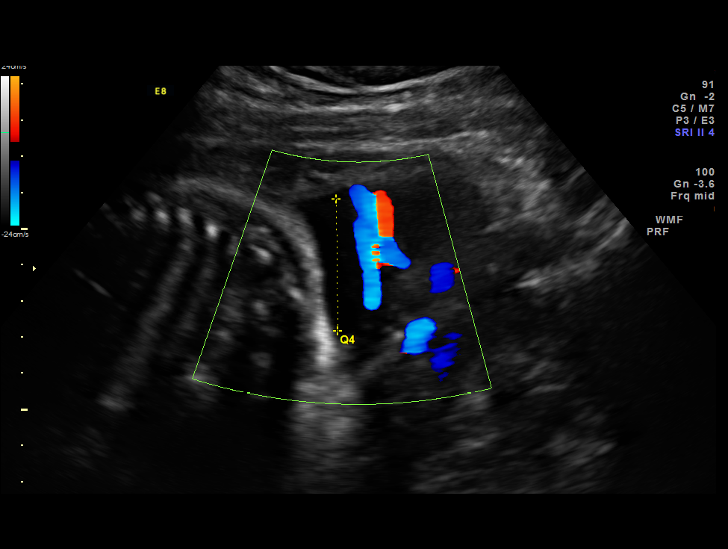
[im 16/35]
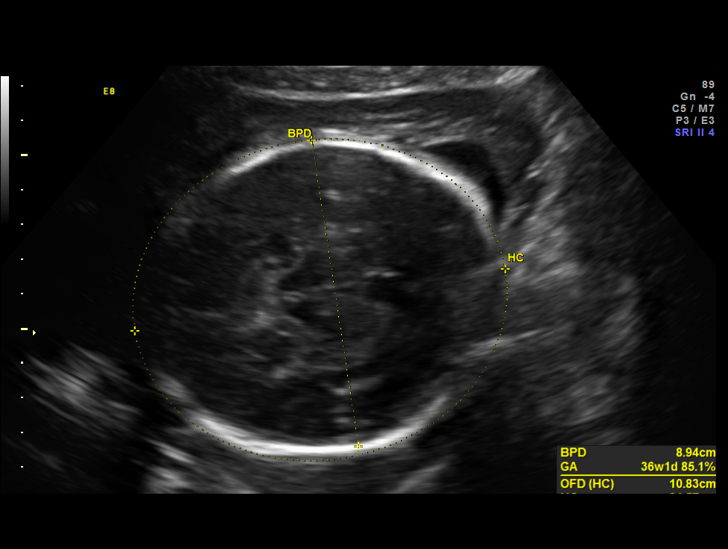
[im 19/35]
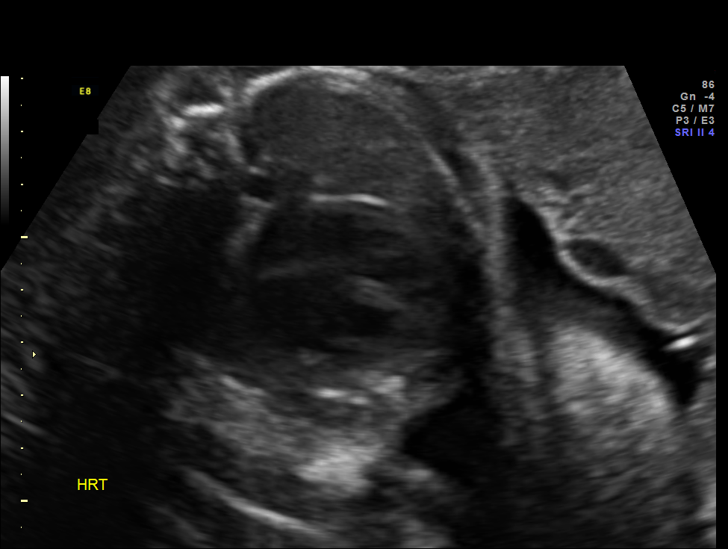
[im 22/35]
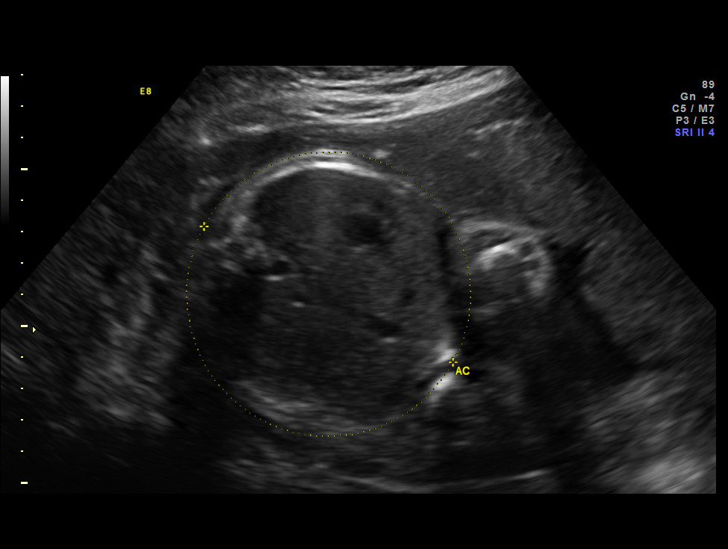
[im 24/35]
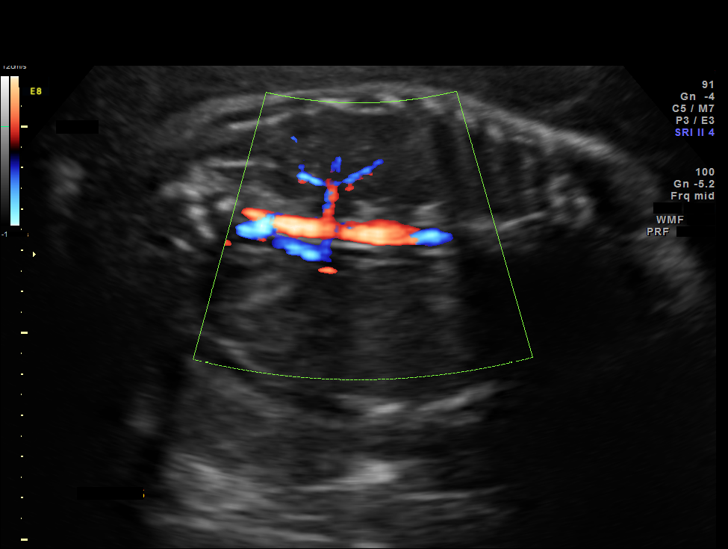
[im 28/35]
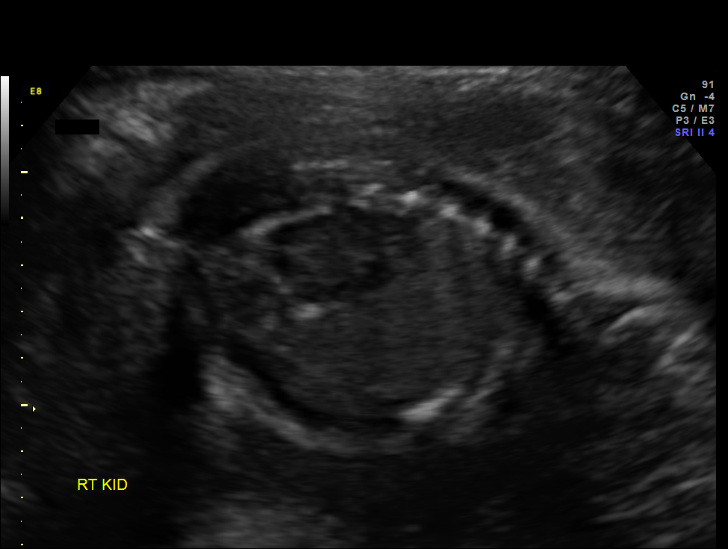
[im 31/35]
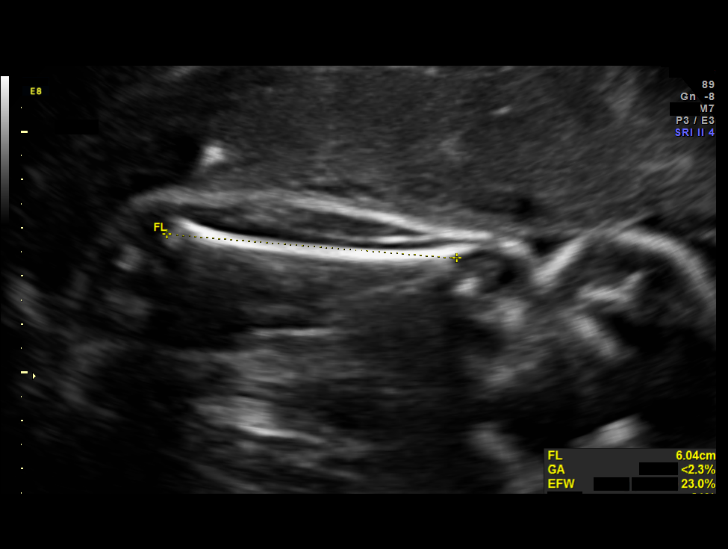
[im 33/35]
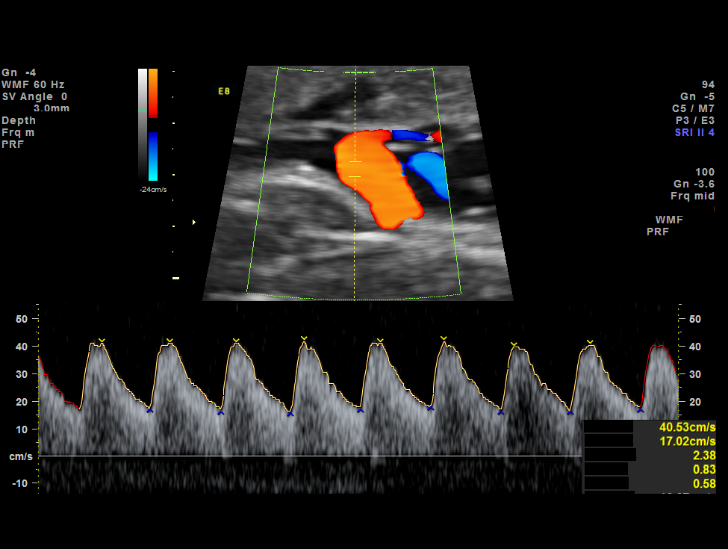

[12 of 28 positions shown; findings below may reference images not displayed]

OBSTETRICS REPORT
                      (Signed Final 12/23/2013 [DATE])

Service(s) Provided

 US OB FOLLOW UP                                       76816.1
 US UA CORD DOPPLER                                    76820.0
Indications

 Size less than dates (Small for gestational [AGE]
 FGR)
 Poor weight gain
 Asthma (on ProAir inhaler)
 Maternal obesity
Fetal Evaluation

 Num Of Fetuses:    1
 Fetal Heart Rate:  138                          bpm
 Cardiac Activity:  Observed
 Presentation:      Cephalic
 Placenta:          Anterior, above cervical os
 P. Cord            Previously Visualized
 Insertion:

 Amniotic Fluid
 AFI FV:      Subjectively within normal limits
 AFI Sum:     11.19   cm       29  %Tile     Larg Pckt:    3.65  cm
 RUQ:   2.52    cm   RLQ:    3.65   cm    LUQ:   2.78    cm   LLQ:    2.24   cm
Biophysical Evaluation

 Amniotic F.V:   Pocket => 2 cm two         F. Tone:        Observed
                 planes
 F. Movement:    Observed                   Score:          [DATE]
 F. Breathing:   Observed
Biometry

 BPD:     89.1  mm     G. Age:  36w 0d                CI:         82.0   70 - 86
 OFD:    108.6  mm                                    FL/HC:      19.1   20.1 -

 HC:     315.2  mm     G. Age:  35w 2d       28  %    HC/AC:      1.10   0.93 -

 AC:     287.7  mm     G. Age:  32w 5d        8  %    FL/BPD:     67.5   71 - 87
 FL:      60.1  mm     G. Age:  31w 2d      < 3  %    FL/AC:      20.9   20 - 24
 HUM:       55  mm     G. Age:  32w 0d        7  %
 Est. FW:    7941  gm      4 lb 9 oz     23  %
Gestational Age

 U/S Today:     33w 6d                                        EDD:   02/04/14
 Best:          34w 6d     Det. By:  Early Ultrasound         EDD:   01/28/14
Anatomy

 Cranium:          Appears normal         Aortic Arch:      Previously seen
 Fetal Cavum:      Appears normal         Ductal Arch:      Previously seen
 Ventricles:       Appears normal         Diaphragm:        Previously seen
 Choroid Plexus:   Previously seen        Stomach:          Appears normal
 Cerebellum:       Previously seen        Abdomen:          Appears normal
 Posterior Fossa:  Previously seen        Abdominal Wall:   Previously seen
 Nuchal Fold:      Previously seen        Cord Vessels:     Previously seen
 Face:             Orbits and profile     Kidneys:          Appear normal
                   previously seen
 Lips:             Previously seen        Bladder:          Appears normal
 Heart:            Appears normal         Spine:            Previously seen
                   (4CH, axis, and
                   situs)
 RVOT:             Previously seen        Lower             Previously seen
                                          Extremities:
 LVOT:             Previously seen        Upper             Previously seen
                                          Extremities:

 Other:  Male gender previously seen. Heels and 5th digit previously seen.
Doppler - Fetal Vessels

 Umbilical Artery
 S/D:   2.5            51  %tile       RI:
 PI:    0.9                            PSV:       46.76   cm/s
 Umbilical Artery
 Absent DFV:    No     Reverse DFV:    No

Cervix Uterus Adnexa

 Cervix:       Not visualized (advanced GA >13wks)
 Left Ovary:    Within normal limits.
 Right Ovary:   Not visualized.

 Adnexa:     No abnormality visualized.
Impression

 IUP at 34+6 weeks
 Normal interval anatomy; anatomic survey complete
 Normal amniotic fluid volume
 EFW at the 23rd %tile; AC at the 8th %tile
 UA dopplers were normal for this GA
 BPP [DATE]
Recommendations

 Continue weekly UA dopplers and BPPs

 questions or concerns.
